# Patient Record
Sex: Male | Born: 1955
Health system: Southern US, Community
[De-identification: ages and names within clinical notes are randomized; demographics above are authoritative.]

## PROBLEM LIST (undated history)

## (undated) DIAGNOSIS — R112 Nausea with vomiting, unspecified: Secondary | ICD-10-CM

## (undated) DIAGNOSIS — T4145XA Adverse effect of unspecified anesthetic, initial encounter: Secondary | ICD-10-CM

## (undated) DIAGNOSIS — Z9889 Other specified postprocedural states: Secondary | ICD-10-CM

## (undated) DIAGNOSIS — M1990 Unspecified osteoarthritis, unspecified site: Secondary | ICD-10-CM

## (undated) DIAGNOSIS — T8859XA Other complications of anesthesia, initial encounter: Secondary | ICD-10-CM

## (undated) DIAGNOSIS — E785 Hyperlipidemia, unspecified: Secondary | ICD-10-CM

## (undated) HISTORY — DX: Other complications of anesthesia, initial encounter: T88.59XA

## (undated) HISTORY — DX: Unspecified osteoarthritis, unspecified site: M19.90

## (undated) HISTORY — PX: EYE SURGERY: SHX253

## (undated) HISTORY — DX: Hyperlipidemia, unspecified: E78.5

## (undated) HISTORY — DX: Adverse effect of unspecified anesthetic, initial encounter: T41.45XA

## (undated) HISTORY — PX: COLONOSCOPY: SHX174

---

## 1989-05-02 HISTORY — PX: KNEE SURGERY: SHX244

## 2004-07-06 ENCOUNTER — Ambulatory Visit: Payer: Self-pay | Admitting: Internal Medicine

## 2004-07-27 ENCOUNTER — Ambulatory Visit: Payer: Self-pay | Admitting: Internal Medicine

## 2004-09-07 ENCOUNTER — Ambulatory Visit: Payer: Self-pay | Admitting: Internal Medicine

## 2004-09-14 ENCOUNTER — Ambulatory Visit: Payer: Self-pay | Admitting: Internal Medicine

## 2004-09-24 ENCOUNTER — Ambulatory Visit: Payer: Self-pay | Admitting: Internal Medicine

## 2004-10-15 ENCOUNTER — Ambulatory Visit: Payer: Self-pay | Admitting: Internal Medicine

## 2005-06-02 ENCOUNTER — Ambulatory Visit: Payer: Self-pay | Admitting: Internal Medicine

## 2005-06-14 ENCOUNTER — Ambulatory Visit: Payer: Self-pay | Admitting: Internal Medicine

## 2005-11-28 ENCOUNTER — Ambulatory Visit: Payer: Self-pay | Admitting: Internal Medicine

## 2005-12-09 ENCOUNTER — Ambulatory Visit: Payer: Self-pay | Admitting: Internal Medicine

## 2006-03-07 ENCOUNTER — Ambulatory Visit: Payer: Self-pay | Admitting: Internal Medicine

## 2006-03-07 LAB — CONVERTED CEMR LAB
Bilirubin, Direct: 0.2 mg/dL (ref 0.0–0.3)
Chol/HDL Ratio, serum: 3.7
Cholesterol: 143 mg/dL (ref 0–200)
HDL: 38.2 mg/dL — ABNORMAL LOW (ref >39.0)
LDL Cholesterol: 84 mg/dL (ref 0–99)
Total Protein: 6.6 g/dL (ref 6.0–8.3)
Triglyceride fasting, serum: 106 mg/dL (ref 0–149)

## 2006-07-14 ENCOUNTER — Ambulatory Visit: Payer: Self-pay | Admitting: Internal Medicine

## 2006-08-15 ENCOUNTER — Ambulatory Visit: Payer: Self-pay | Admitting: Internal Medicine

## 2006-10-10 ENCOUNTER — Encounter: Admission: RE | Admit: 2006-10-10 | Discharge: 2006-10-10 | Payer: Self-pay | Admitting: Sports Medicine

## 2006-10-27 ENCOUNTER — Ambulatory Visit: Payer: Self-pay | Admitting: Internal Medicine

## 2006-10-27 DIAGNOSIS — M19049 Primary osteoarthritis, unspecified hand: Secondary | ICD-10-CM | POA: Insufficient documentation

## 2006-10-27 DIAGNOSIS — E785 Hyperlipidemia, unspecified: Secondary | ICD-10-CM | POA: Insufficient documentation

## 2006-12-21 ENCOUNTER — Telehealth: Payer: Self-pay | Admitting: Internal Medicine

## 2007-01-22 ENCOUNTER — Ambulatory Visit: Payer: Self-pay | Admitting: Internal Medicine

## 2007-01-22 ENCOUNTER — Encounter: Payer: Self-pay | Admitting: Internal Medicine

## 2007-01-22 DIAGNOSIS — M1990 Unspecified osteoarthritis, unspecified site: Secondary | ICD-10-CM | POA: Insufficient documentation

## 2007-01-22 DIAGNOSIS — M169 Osteoarthritis of hip, unspecified: Secondary | ICD-10-CM | POA: Insufficient documentation

## 2007-01-22 DIAGNOSIS — M1610 Unilateral primary osteoarthritis, unspecified hip: Secondary | ICD-10-CM | POA: Insufficient documentation

## 2007-01-22 LAB — CONVERTED CEMR LAB
Cholesterol, target level: 200 mg/dL
HDL goal, serum: 40 mg/dL
LDL Goal: 130 mg/dL

## 2007-02-12 ENCOUNTER — Telehealth (INDEPENDENT_AMBULATORY_CARE_PROVIDER_SITE_OTHER): Payer: Self-pay | Admitting: *Deleted

## 2007-02-13 ENCOUNTER — Ambulatory Visit: Payer: Self-pay | Admitting: Internal Medicine

## 2007-05-03 HISTORY — PX: OTHER SURGICAL HISTORY: SHX169

## 2007-05-10 ENCOUNTER — Ambulatory Visit: Payer: Self-pay | Admitting: Internal Medicine

## 2007-05-10 LAB — CONVERTED CEMR LAB
ALT: 41 units/L (ref 0–53)
AST: 43 units/L — ABNORMAL HIGH (ref 0–37)
Alkaline Phosphatase: 47 units/L (ref 39–117)
Basophils Relative: 0.5 % (ref 0.0–1.0)
Bilirubin, Direct: 0.3 mg/dL (ref 0.0–0.3)
CO2: 27 meq/L (ref 19–32)
Calcium: 9 mg/dL (ref 8.4–10.5)
Chloride: 102 meq/L (ref 96–112)
Creatinine, Ser: 1.2 mg/dL (ref 0.4–1.5)
Eosinophils Relative: 2.1 % (ref 0.0–5.0)
GFR calc Af Amer: 82 mL/min
Glucose, Bld: 91 mg/dL (ref 70–99)
LDL Cholesterol: 98 mg/dL (ref 0–99)
Lymphocytes Relative: 30.7 % (ref 12.0–46.0)
Neutro Abs: 3.5 10*3/uL (ref 1.4–7.7)
Nitrite: NEGATIVE
Platelets: 173 10*3/uL (ref 150–400)
RDW: 12.4 % (ref 11.5–14.6)
Specific Gravity, Urine: 1.015
Total Bilirubin: 1.7 mg/dL — ABNORMAL HIGH (ref 0.3–1.2)
Total Protein: 6.6 g/dL (ref 6.0–8.3)
Triglycerides: 76 mg/dL (ref 0–149)
VLDL: 15 mg/dL (ref 0–40)
WBC Urine, dipstick: NEGATIVE
WBC: 6.1 10*3/uL (ref 4.5–10.5)

## 2007-05-16 ENCOUNTER — Ambulatory Visit: Payer: Self-pay | Admitting: Internal Medicine

## 2007-06-11 ENCOUNTER — Encounter: Payer: Self-pay | Admitting: Internal Medicine

## 2007-06-13 ENCOUNTER — Encounter: Payer: Self-pay | Admitting: Internal Medicine

## 2007-06-18 ENCOUNTER — Telehealth: Payer: Self-pay | Admitting: Internal Medicine

## 2007-06-22 ENCOUNTER — Encounter: Payer: Self-pay | Admitting: Internal Medicine

## 2007-09-05 ENCOUNTER — Telehealth: Payer: Self-pay | Admitting: Internal Medicine

## 2007-09-25 ENCOUNTER — Telehealth (INDEPENDENT_AMBULATORY_CARE_PROVIDER_SITE_OTHER): Payer: Self-pay | Admitting: *Deleted

## 2007-10-29 ENCOUNTER — Ambulatory Visit: Payer: Self-pay | Admitting: Internal Medicine

## 2007-10-29 LAB — CONVERTED CEMR LAB
ALT: 31 units/L (ref 0–53)
Basophils Absolute: 0 10*3/uL (ref 0.0–0.1)
Basophils Relative: 0.4 % (ref 0.0–1.0)
Bilirubin Urine: NEGATIVE
Bilirubin, Direct: 0.2 mg/dL (ref 0.0–0.3)
Blood in Urine, dipstick: NEGATIVE
CO2: 29 meq/L (ref 19–32)
Calcium: 8.7 mg/dL (ref 8.4–10.5)
Chloride: 108 meq/L (ref 96–112)
Cholesterol: 141 mg/dL (ref 0–200)
Creatinine, Ser: 1.1 mg/dL (ref 0.4–1.5)
Eosinophils Absolute: 0.1 10*3/uL (ref 0.0–0.7)
GFR calc non Af Amer: 74 mL/min
Glucose, Urine, Semiquant: NEGATIVE
Ketones, urine, test strip: NEGATIVE
LDL Cholesterol: 72 mg/dL (ref 0–99)
Lymphocytes Relative: 25.4 % (ref 12.0–46.0)
MCHC: 34.1 g/dL (ref 30.0–36.0)
MCV: 89.6 fL (ref 78.0–100.0)
Neutro Abs: 3.5 10*3/uL (ref 1.4–7.7)
Neutrophils Relative %: 62.4 % (ref 43.0–77.0)
PSA: 0.33 ng/mL (ref 0.10–4.00)
Protein, U semiquant: NEGATIVE
RDW: 14 % (ref 11.5–14.6)
Sodium: 142 meq/L (ref 135–145)
TSH: 0.76 microintl units/mL (ref 0.35–5.50)
Total Bilirubin: 2 mg/dL — ABNORMAL HIGH (ref 0.3–1.2)
Triglycerides: 115 mg/dL (ref 0–149)
Urobilinogen, UA: 0.2
VLDL: 23 mg/dL (ref 0–40)
pH: 5.5

## 2007-11-05 ENCOUNTER — Ambulatory Visit: Payer: Self-pay | Admitting: Internal Medicine

## 2008-02-26 ENCOUNTER — Telehealth: Payer: Self-pay | Admitting: Internal Medicine

## 2008-04-09 ENCOUNTER — Telehealth: Payer: Self-pay | Admitting: Internal Medicine

## 2008-05-12 ENCOUNTER — Ambulatory Visit: Payer: Self-pay | Admitting: Internal Medicine

## 2008-05-12 LAB — CONVERTED CEMR LAB
ALT: 82 units/L — ABNORMAL HIGH (ref 0–53)
AST: 144 units/L — ABNORMAL HIGH (ref 0–37)
Albumin: 4.1 g/dL (ref 3.5–5.2)
Cholesterol: 144 mg/dL (ref 0–200)
HDL: 49.8 mg/dL (ref >39.0)
Total Protein: 6.8 g/dL (ref 6.0–8.3)
Triglycerides: 53 mg/dL (ref 0–149)
VLDL: 11 mg/dL (ref 0–40)

## 2008-05-19 ENCOUNTER — Ambulatory Visit: Payer: Self-pay | Admitting: Internal Medicine

## 2008-05-19 DIAGNOSIS — R7401 Elevation of levels of liver transaminase levels: Secondary | ICD-10-CM | POA: Insufficient documentation

## 2008-05-19 DIAGNOSIS — R740 Nonspecific elevation of levels of transaminase and lactic acid dehydrogenase [LDH]: Secondary | ICD-10-CM

## 2008-05-19 LAB — CONVERTED CEMR LAB
Hep B C IgM: NEGATIVE
Hepatitis B Surface Ag: NEGATIVE
LDL Goal: 130 mg/dL

## 2008-05-23 ENCOUNTER — Encounter: Payer: Self-pay | Admitting: Internal Medicine

## 2008-05-23 LAB — CONVERTED CEMR LAB
AST: 177 units/L — ABNORMAL HIGH (ref 0–37)
Albumin: 4.3 g/dL (ref 3.5–5.2)
Iron: 135 ug/dL (ref 42–165)
Saturation Ratios: 42.4 % (ref 20.0–50.0)
Total Bilirubin: 2.2 mg/dL — ABNORMAL HIGH (ref 0.3–1.2)
Transferrin: 227.4 mg/dL (ref >=212.0)

## 2008-05-27 IMAGING — RF DG FLUORO GUIDE NDL PLC/BX
2 series · 2 of 2 positions shown · IV contrast (magnevist)
Comparison: none

CLINICAL DATA: Right hip pain. 
 FLUOROSCOPICALLY GUIDED INJECTION OF THE RIGHT HIP FOR MR:
 The skin anterior and lateral to the right hip was cleansed and anesthetized.  A 22 gauge spinal needle was directed into the hip joint on one pass.  I injected 10 cc of liquid containing iodinated contrast, 1% Lidocaine, and a few drops of Magnevist.  The procedure was well-tolerated, and the patient was taken to MR immediately for scanning.

[Series 1: (hospital) · 1 of 1 slices shown (1 of 2)]
[im 1/1]
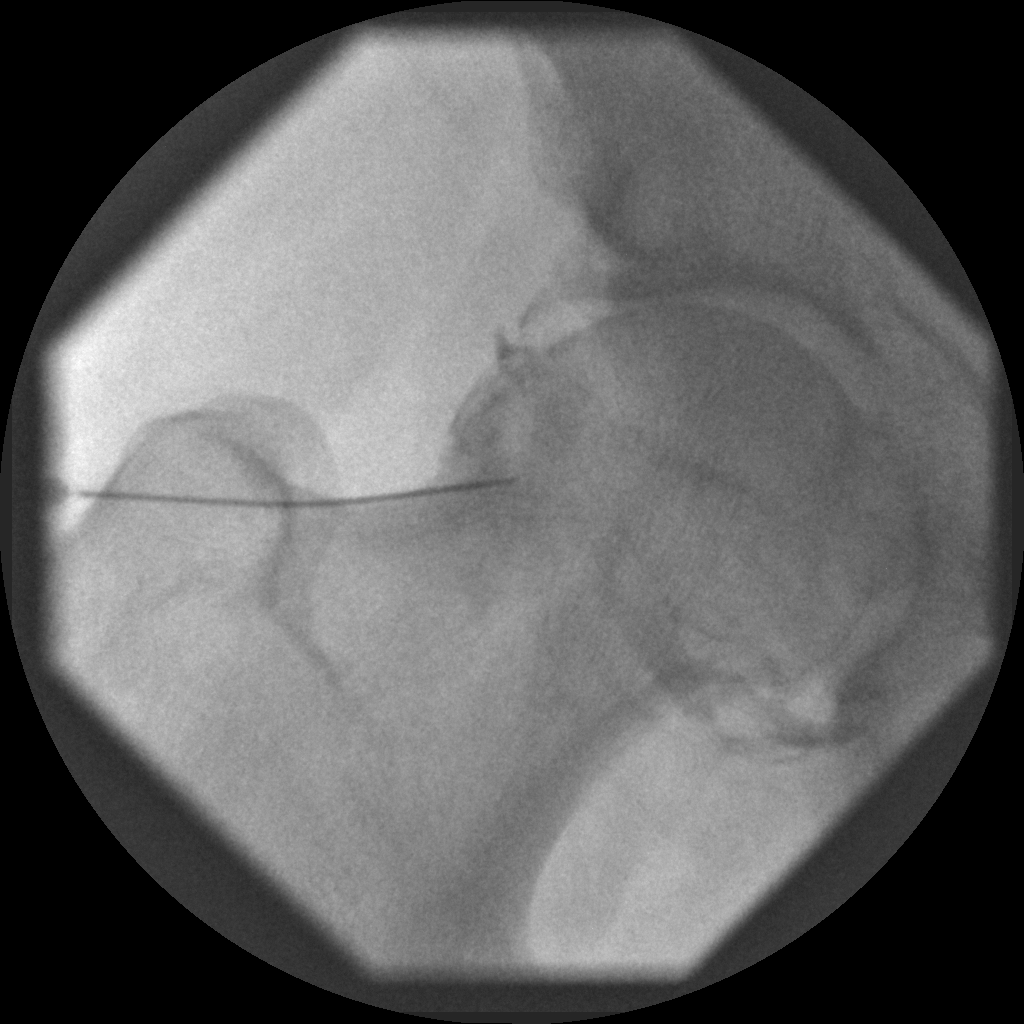

[Series 2: (hospital) · 1 of 1 slices shown (2 of 2)]
[im 1/1]
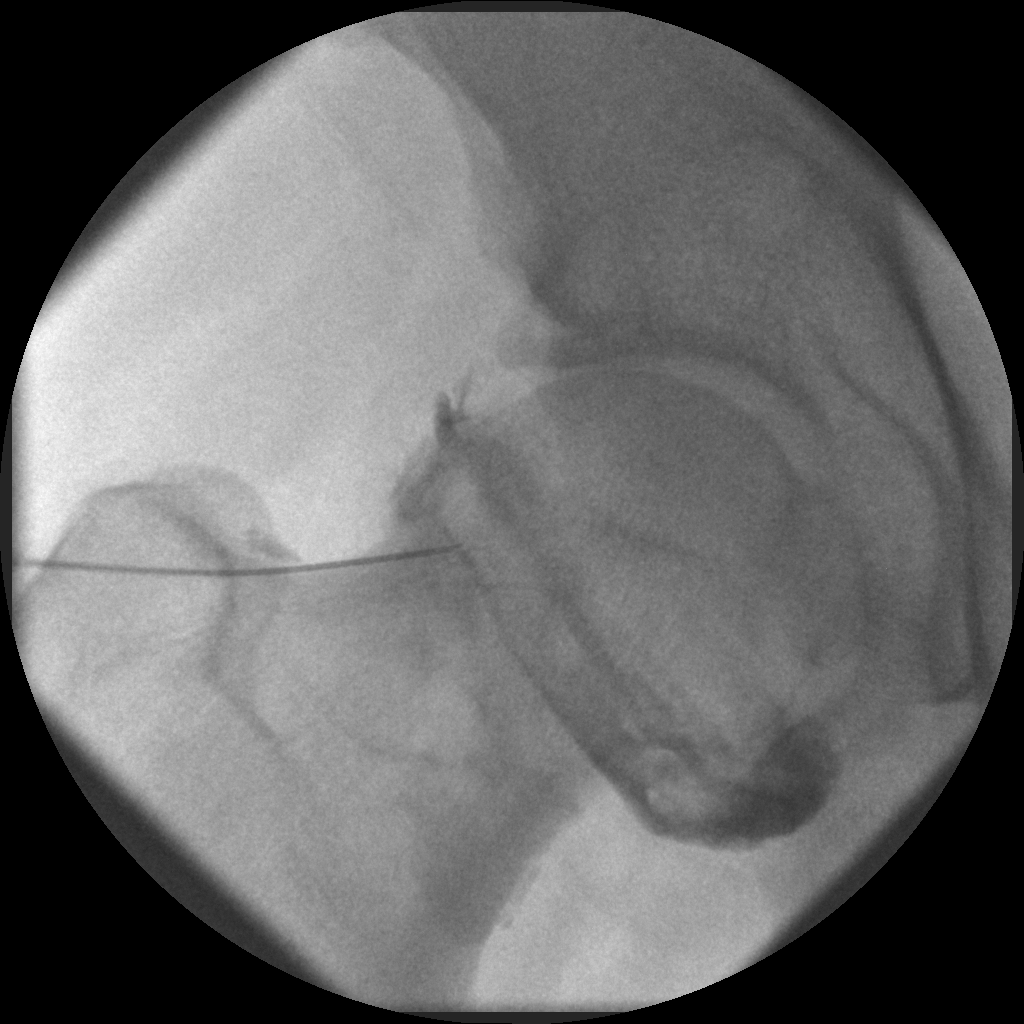

[2 of 2 positions shown; findings below may reference images not displayed]

IMPRESSION: Technically successful right hip injection for MRI.

## 2008-05-28 ENCOUNTER — Encounter: Admission: RE | Admit: 2008-05-28 | Discharge: 2008-05-28 | Payer: Self-pay | Admitting: Internal Medicine

## 2008-05-30 ENCOUNTER — Ambulatory Visit: Payer: Self-pay | Admitting: Internal Medicine

## 2008-05-30 LAB — CONVERTED CEMR LAB
ALT: 37 units/L (ref 0–53)
AST: 30 units/L (ref 0–37)
Total Bilirubin: 1.3 mg/dL — ABNORMAL HIGH (ref 0.3–1.2)
Total Protein: 6.6 g/dL (ref 6.0–8.3)

## 2008-06-19 ENCOUNTER — Encounter: Payer: Self-pay | Admitting: Internal Medicine

## 2008-07-31 ENCOUNTER — Telehealth: Payer: Self-pay | Admitting: Internal Medicine

## 2008-08-08 ENCOUNTER — Ambulatory Visit: Payer: Self-pay | Admitting: Internal Medicine

## 2008-08-08 LAB — CONVERTED CEMR LAB
AST: 22 units/L (ref 0–37)
Alkaline Phosphatase: 41 units/L (ref 39–117)
Bilirubin, Direct: 0.2 mg/dL (ref 0.0–0.3)
Cholesterol: 148 mg/dL (ref 0–200)
LDL Cholesterol: 84 mg/dL (ref 0–99)
Total Protein: 6.6 g/dL (ref 6.0–8.3)

## 2008-08-21 ENCOUNTER — Telehealth: Payer: Self-pay | Admitting: *Deleted

## 2008-10-09 ENCOUNTER — Telehealth: Payer: Self-pay | Admitting: Internal Medicine

## 2008-10-30 ENCOUNTER — Ambulatory Visit: Payer: Self-pay | Admitting: Internal Medicine

## 2008-10-30 LAB — CONVERTED CEMR LAB
ALT: 29 units/L (ref 0–53)
Alkaline Phosphatase: 43 units/L (ref 39–117)
Basophils Relative: 0.7 % (ref 0.0–3.0)
Bilirubin, Direct: 0 mg/dL (ref 0.0–0.3)
Blood in Urine, dipstick: NEGATIVE
Calcium: 8.8 mg/dL (ref 8.4–10.5)
Creatinine, Ser: 1 mg/dL (ref 0.4–1.5)
Eosinophils Absolute: 0.2 10*3/uL (ref 0.0–0.7)
Eosinophils Relative: 3.6 % (ref 0.0–5.0)
HDL: 53.2 mg/dL (ref >39.00)
Ketones, urine, test strip: NEGATIVE
LDL Cholesterol: 77 mg/dL (ref 0–99)
Lymphocytes Relative: 32.4 % (ref 12.0–46.0)
Monocytes Relative: 8.9 % (ref 3.0–12.0)
Neutrophils Relative %: 54.4 % (ref 43.0–77.0)
Nitrite: NEGATIVE
RBC: 4.42 M/uL (ref 4.22–5.81)
Total CHOL/HDL Ratio: 3
Total Protein: 6.6 g/dL (ref 6.0–8.3)
Triglycerides: 58 mg/dL (ref 0.0–149.0)
Urobilinogen, UA: 0.2
WBC Urine, dipstick: NEGATIVE
WBC: 4.8 10*3/uL (ref 4.5–10.5)

## 2008-11-24 ENCOUNTER — Ambulatory Visit: Payer: Self-pay | Admitting: Internal Medicine

## 2009-05-22 ENCOUNTER — Ambulatory Visit: Payer: Self-pay | Admitting: Internal Medicine

## 2009-05-22 LAB — CONVERTED CEMR LAB
Bilirubin, Direct: 0.2 mg/dL (ref 0.0–0.3)
Cholesterol: 158 mg/dL (ref 0–200)
LDL Cholesterol: 90 mg/dL (ref 0–99)
Total Protein: 7 g/dL (ref 6.0–8.3)
VLDL: 10.8 mg/dL (ref 0.0–40.0)

## 2009-05-29 ENCOUNTER — Ambulatory Visit: Payer: Self-pay | Admitting: Internal Medicine

## 2009-11-24 ENCOUNTER — Ambulatory Visit: Payer: Self-pay | Admitting: Internal Medicine

## 2009-11-24 LAB — CONVERTED CEMR LAB
BUN: 19 mg/dL (ref 6–23)
Bilirubin Urine: NEGATIVE
Bilirubin, Direct: 0.2 mg/dL (ref 0.0–0.3)
CO2: 32 meq/L (ref 19–32)
Chloride: 106 meq/L (ref 96–112)
Cholesterol: 165 mg/dL (ref 0–200)
Creatinine, Ser: 1.2 mg/dL (ref 0.4–1.5)
Eosinophils Absolute: 0.1 10*3/uL (ref 0.0–0.7)
Glucose, Bld: 97 mg/dL (ref 70–99)
Ketones, urine, test strip: NEGATIVE
LDL Cholesterol: 91 mg/dL (ref 0–99)
MCHC: 34.2 g/dL (ref 30.0–36.0)
MCV: 92.1 fL (ref 78.0–100.0)
Monocytes Absolute: 0.5 10*3/uL (ref 0.1–1.0)
Neutrophils Relative %: 57.2 % (ref 43.0–77.0)
PSA: 0.42 ng/mL (ref 0.10–4.00)
Platelets: 147 10*3/uL — ABNORMAL LOW (ref 150.0–400.0)
RDW: 13.6 % (ref 11.5–14.6)
Specific Gravity, Urine: 1.025
TSH: 1.46 microintl units/mL (ref 0.35–5.50)
Total Bilirubin: 1.4 mg/dL — ABNORMAL HIGH (ref 0.3–1.2)
Total Protein: 6.7 g/dL (ref 6.0–8.3)
Triglycerides: 114 mg/dL (ref 0.0–149.0)
Urobilinogen, UA: 0.2
pH: 5

## 2009-12-30 ENCOUNTER — Ambulatory Visit: Payer: Self-pay | Admitting: Internal Medicine

## 2009-12-30 DIAGNOSIS — J208 Acute bronchitis due to other specified organisms: Secondary | ICD-10-CM

## 2009-12-30 DIAGNOSIS — B9689 Other specified bacterial agents as the cause of diseases classified elsewhere: Secondary | ICD-10-CM | POA: Insufficient documentation

## 2010-01-13 IMAGING — US US ABDOMEN COMPLETE
1 series · 14 of 25 positions shown · non-contrast
Comparison: None

CLINICAL DATA: Abnormal liver function tests

ABDOMEN ULTRASOUND
TECHNIQUE: Complete abdominal ultrasound examination was performed
including evaluation of the liver, gallbladder, bile ducts,
pancreas, kidneys, spleen, IVC, and abdominal aorta.

[Series 1: us abdomen complete · 0.32mm/px · 14 of 74 slices shown]
[im 1/74]
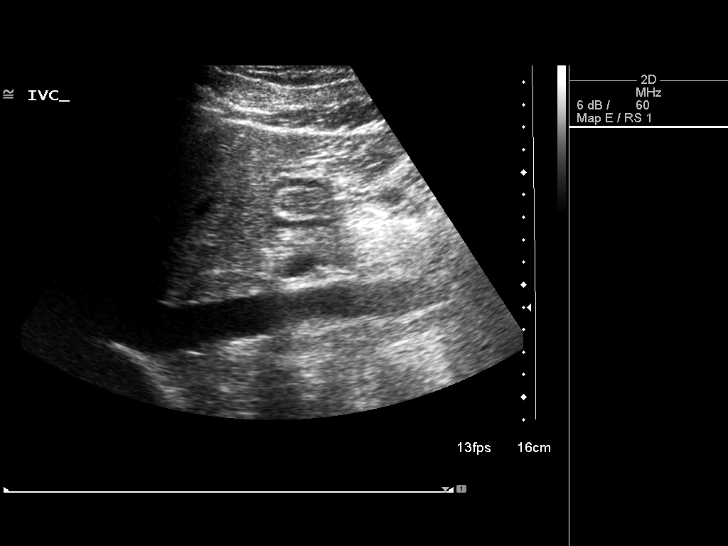
[im 7/74]
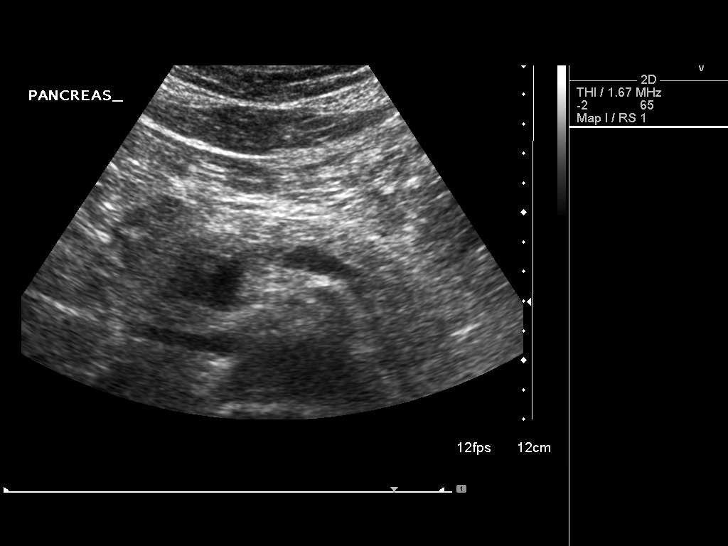
[im 13/74]
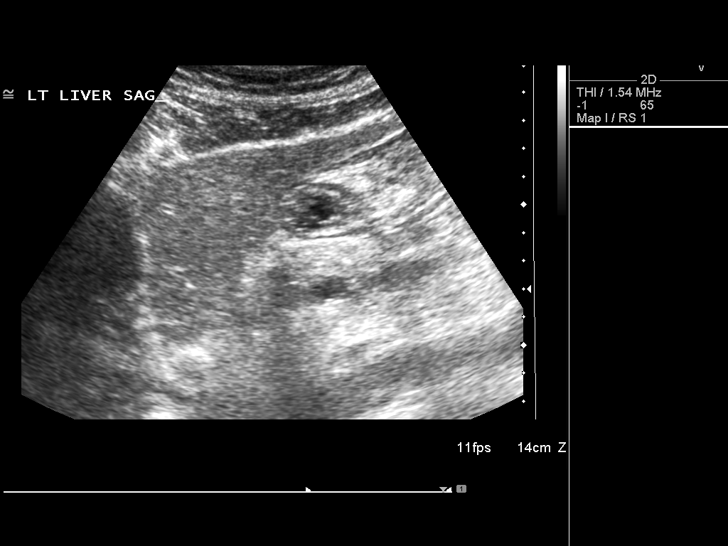
[im 19/74]
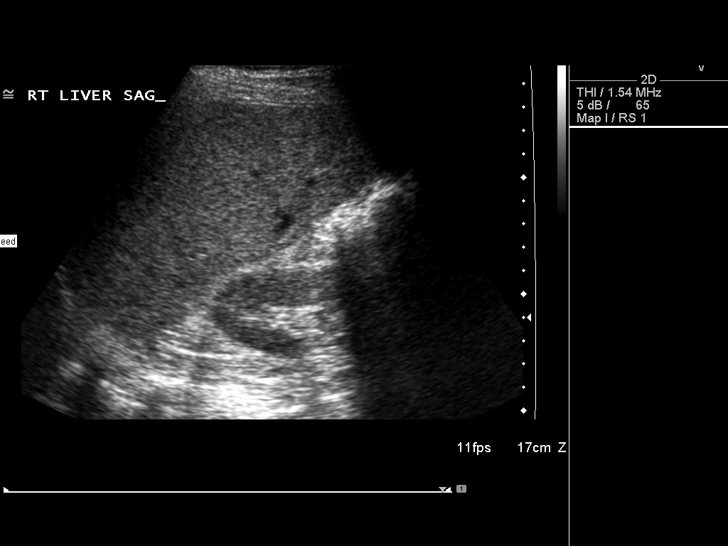
[im 25/74]
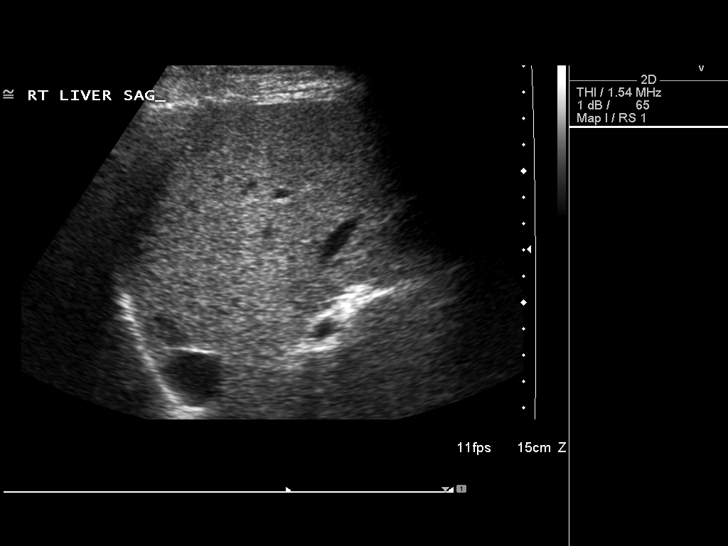
[im 28/74]
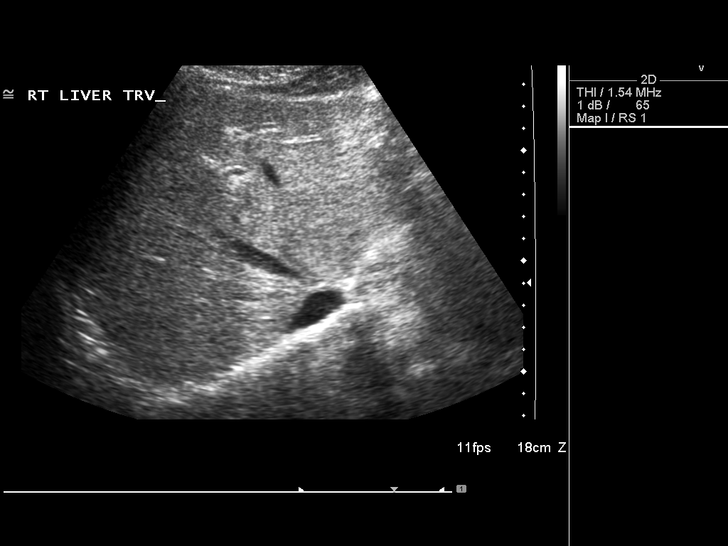
[im 34/74]
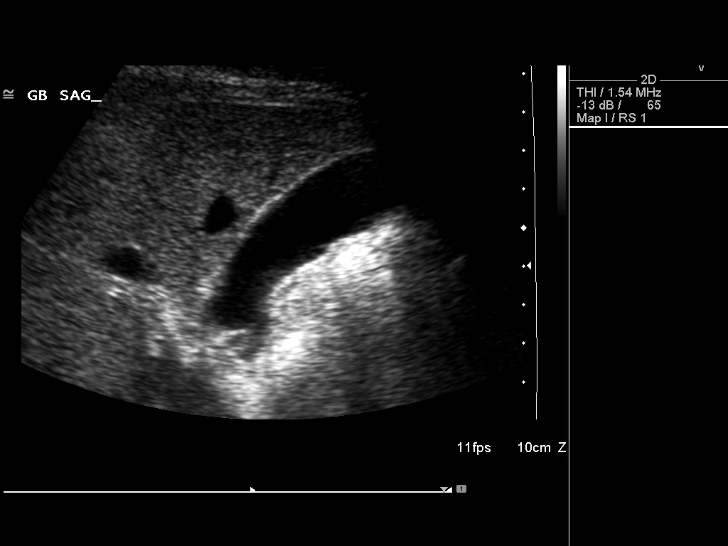
[im 40/74]
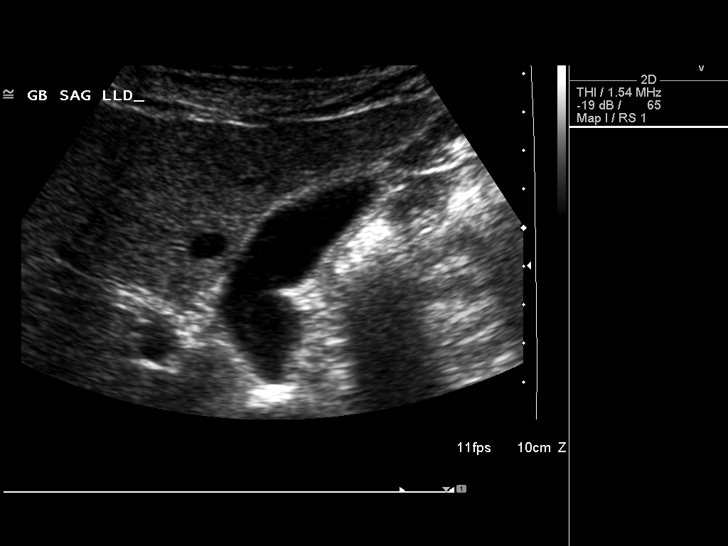
[im 46/74]
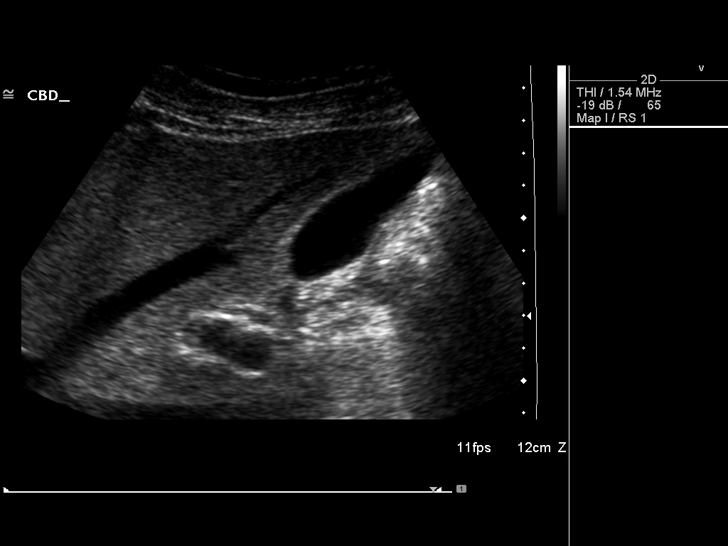
[im 49/74]
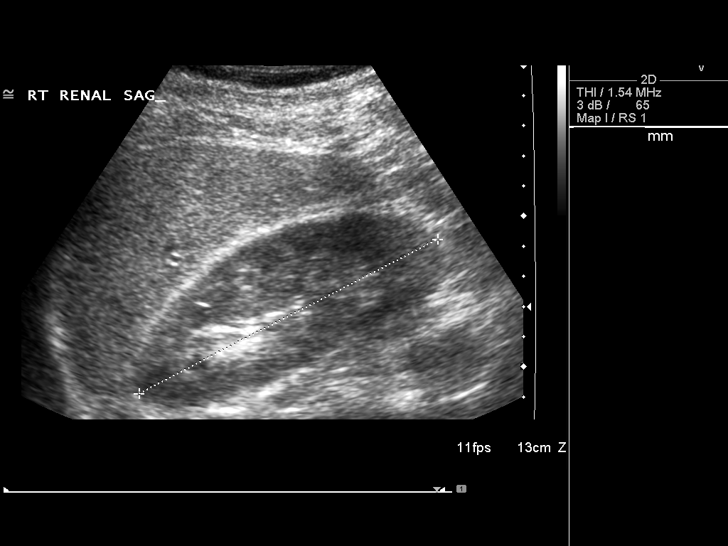
[im 55/74]
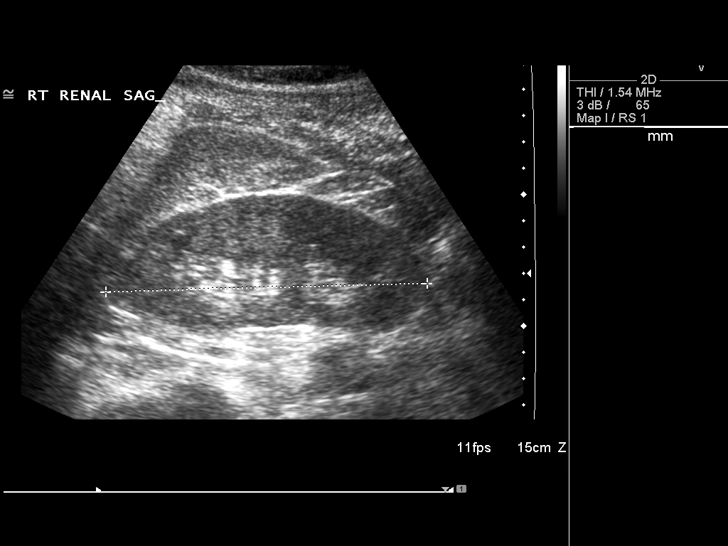
[im 61/74]
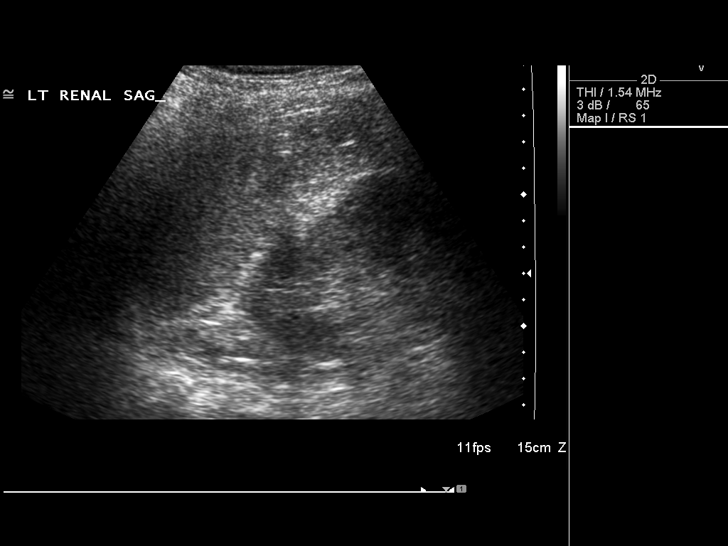
[im 67/74]
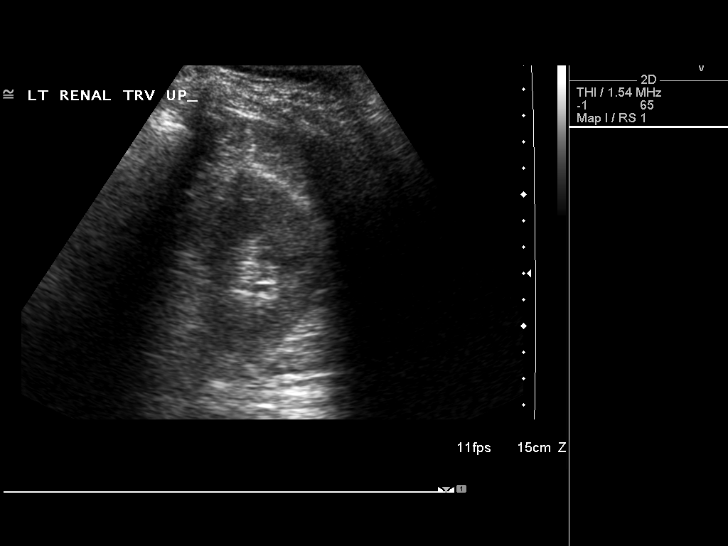
[im 74/74]
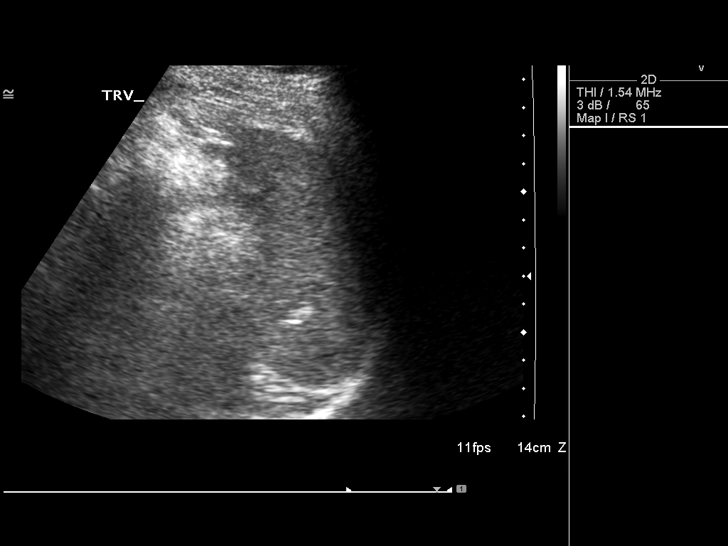

[14 of 25 positions shown; findings below may reference images not displayed]

FINDINGS: The gallbladder is well seen and no gallstones are noted.
The liver has a normal echogenic pattern and the common bile duct
is normal measuring 3.5 mm in diameter.  IVC, pancreas, and spleen
appear normal.  No hydronephrosis is seen.  The right kidney
measures 11.1 cm sagittally, with left kidney measuring 11.0 cm.
The abdominal aorta is normal in caliber.
IMPRESSION: Negative abdominal ultrasound.  No gallstones.  No ductal
dilatation.

## 2010-05-23 ENCOUNTER — Encounter: Payer: Self-pay | Admitting: Sports Medicine

## 2010-06-01 NOTE — Assessment & Plan Note (Signed)
Summary: cpx/mm/pt rescd//ccm   Vital Signs:  Patient profile:   54 year old male Height:      70 inches Weight:      195 pounds BMI:     28.08 Temp:     98.2 degrees F oral Pulse rate:   54 / minute Resp:     14 per minute BP sitting:   110 / 70  (left arm)  Vitals Entered By: Willy Eddy, LPN (December 30, 2009 11:43 AM) CC: cpx, Lipid Management Is Patient Diabetic? No   CC:  cpx and Lipid Management.  History of Present Illness: The pt has been stable with the HIP resurfacing with metal on metal hip with no problems   Lipid Management History:      Positive NCEP/ATP III risk factors include male age 49 years old or older and family history for ischemic heart disease (males less than 56 years old).  Negative NCEP/ATP III risk factors include non-diabetic, non-tobacco-user status, non-hypertensive, no ASHD (atherosclerotic heart disease), no prior stroke/TIA, no peripheral vascular disease, and no history of aortic aneurysm.     Preventive Screening-Counseling & Management  Alcohol-Tobacco     Smoking Status: never  Problems Prior to Update: 1)  Transaminases, Serum, Elevated  (ICD-790.4) 2)  Physical Examination  (ICD-V70.0) 3)  Asthma, Mild  (ICD-493.90) 4)  Lymphadenopathy  (ICD-785.6) 5)  Family History of Cad Male 1st Degree Relative <50  (ICD-V17.3) 6)  Osteoarthritis  (ICD-715.90) 7)  Osteoarthrosis, Local Nos, Pelvis/thigh  (ICD-715.35) 8)  Osteoarthrosis, Local Nos, Hand  (ICD-715.34) 9)  Hyperlipidemia  (ICD-272.4)  Current Problems (verified): 1)  Transaminases, Serum, Elevated  (ICD-790.4) 2)  Physical Examination  (ICD-V70.0) 3)  Asthma, Mild  (ICD-493.90) 4)  Lymphadenopathy  (ICD-785.6) 5)  Family History of Cad Male 1st Degree Relative <50  (ICD-V17.3) 6)  Osteoarthritis  (ICD-715.90) 7)  Osteoarthrosis, Local Nos, Pelvis/thigh  (ICD-715.35) 8)  Osteoarthrosis, Local Nos, Hand  (ICD-715.34) 9)  Hyperlipidemia  (ICD-272.4)  Medications  Prior to Update: 1)  Aspirin 81 Mg  Tabs (Aspirin) .... Once Daily 2)  Lipitor 20 Mg  Tabs (Atorvastatin Calcium) .... Take 1 Tablet By Mouth Once A Day 3)  Mega Multi Men   Tbcr (Multiple Vitamins-Minerals) .... Once Daily 4)  Fish Oil   Caps (Omega-3 Fatty Acids Caps) .... 2 Two Times A Day 5)  Pulmicort Flexhaler 180 Mcg/act  Inha (Budesonide) .... Use As Directed  Current Medications (verified): 1)  Aspirin 81 Mg  Tabs (Aspirin) .... Once Daily 2)  Lipitor 20 Mg  Tabs (Atorvastatin Calcium) .... Take 1 Tablet By Mouth Once A Day 3)  Mega Multi Men   Tbcr (Multiple Vitamins-Minerals) .... Once Daily 4)  Fish Oil   Caps (Omega-3 Fatty Acids Caps) .... 2 Two Times A Day 5)  Pulmicort Flexhaler 180 Mcg/act  Inha (Budesonide) .... Use As Directed  Allergies (verified): 1)  ! Pcn  Past History:  Family History: Last updated: 01/22/2007 Family History Lung cancer  mother Family History of Stroke F 1st degree relative <60  Family History of CAD Male 1st degree relative <50 Family History of Stroke M 1st degree relative <50 maternal grand father daibetes in maternal aunt  Social History: Last updated: 10/27/2006 Married Never Smoked Alcohol use-yes Drug use-no  Risk Factors: Smoking Status: never (12/30/2009)  Past medical, surgical, family and social histories (including risk factors) reviewed, and no changes noted (except as noted below).  Past Medical History: Reviewed history from 01/22/2007 and  no changes required. Hyperlipidemia Osteoarthritis  Past Surgical History: Reviewed history from 11/05/2007 and no changes required. Denies surgical history Total hip resurface right  Family History: Reviewed history from 01/22/2007 and no changes required. Family History Lung cancer  mother Family History of Stroke F 1st degree relative <60  Family History of CAD Male 1st degree relative <50 Family History of Stroke M 1st degree relative <50 maternal grand  father daibetes in maternal aunt  Social History: Reviewed history from 10/27/2006 and no changes required. Married Never Smoked Alcohol use-yes Drug use-no  Review of Systems  The patient denies anorexia, fever, weight loss, weight gain, vision loss, decreased hearing, hoarseness, chest pain, syncope, dyspnea on exertion, peripheral edema, prolonged cough, headaches, hemoptysis, abdominal pain, melena, hematochezia, severe indigestion/heartburn, hematuria, incontinence, genital sores, muscle weakness, suspicious skin lesions, transient blindness, difficulty walking, depression, unusual weight change, abnormal bleeding, enlarged lymph nodes, angioedema, breast masses, and testicular masses.    Physical Exam  General:  Well-developed,well-nourished,in no acute distress; alert,appropriate and cooperative throughout examination Head:  normocephalic, atraumatic, and male-pattern balding.   Eyes:  No corneal or conjunctival inflammation noted. EOMI. Perrla. Funduscopic exam benign, without hemorrhages, exudates or papilledema. Vision grossly normal. Ears:  R ear normal and L ear normal.   Nose:  External nasal examination shows no deformity or inflammation. Nasal mucosa are pink and moist without lesions or exudates. Mouth:  Oral mucosa and oropharynx without lesions or exudates.  Teeth in good repair. Neck:  No deformities, masses, or tenderness noted. Chest Wall:  No deformities, masses, tenderness or gynecomastia noted. Breasts:  No masses or gynecomastia noted Lungs:  normal respiratory effort and no intercostal retractions.   Heart:  normal rate and regular rhythm.   Abdomen:  Bowel sounds positive,abdomen soft and non-tender without masses, organomegaly or hernias noted. Msk:  no joint swelling, no joint warmth, and no redness over joints.   Pulses:  R and L carotid,radial,femoral,dorsalis pedis and posterior tibial pulses are full and equal bilaterally Extremities:  No clubbing,  cyanosis, edema, or deformity noted with normal full range of motion of all joints.   Neurologic:  No cranial nerve deficits noted. Station and gait are normal. Plantar reflexes are down-going bilaterally. DTRs are symmetrical throughout. Sensory, motor and coordinative functions appear intact. Skin:  Intact without suspicious lesions or rashes Cervical Nodes:  No lymphadenopathy noted Axillary Nodes:  No palpable lymphadenopathy Inguinal Nodes:  No significant adenopathy Psych:  Cognition and judgment appear intact. Alert and cooperative with normal attention span and concentration. No apparent delusions, illusions, hallucinations    Impression & Recommendations:  Problem # 1:  PHYSICAL EXAMINATION (ICD-V70.0)  Orders: EKG w/ Interpretation (93000)  Colonoscopy: Normal (10/15/2004) Td Booster: Historical (05/02/2005)   Chol: 165 (11/24/2009)   HDL: 51.00 (11/24/2009)   LDL: 91 (11/24/2009)   TG: 114.0 (11/24/2009) TSH: 1.46 (11/24/2009)   PSA: 0.42 (11/24/2009)  Discussed using sunscreen, use of alcohol, drug use, self testicular exam, routine dental care, routine eye care, routine physical exam, seat belts, multiple vitamins, osteoporosis prevention, adequate calcium intake in diet, and recommendations for immunizations.  Discussed exercise and checking cholesterol.  Discussed gun safety, safe sex, and contraception. Also recommend checking PSA.  Problem # 2:  HYPERLIPIDEMIA (ICD-272.4)  His updated medication list for this problem includes:    Lipitor 20 Mg Tabs (Atorvastatin calcium) .Marland Kitchen... Take 1 tablet by mouth once a day  Labs Reviewed: SGOT: 27 (11/24/2009)   SGPT: 28 (11/24/2009)  Lipid Goals: Chol Goal: 200 (  05/16/2007)   HDL Goal: 40 (05/16/2007)   LDL Goal: 130 (05/19/2008)   TG Goal: 150 (05/19/2008)  Prior 10 Yr Risk Heart Disease: 3 % (05/16/2007)   HDL:51.00 (11/24/2009), 57.70 (05/22/2009)  LDL:91 (11/24/2009), 90 (05/22/2009)  Chol:165 (11/24/2009), 158  (05/22/2009)  Trig:114.0 (11/24/2009), 54.0 (05/22/2009)  Problem # 3:  COUGH VARIANT ASTHMA (ICD-493.82) has very very mild reactive airway dz that seems to be related the cold and running  but no air flow or capacity issues His updated medication list for this problem includes:    Pulmicort Flexhaler 180 Mcg/act Inha (Budesonide) ..... Use as directed  Complete Medication List: 1)  Aspirin 81 Mg Tabs (Aspirin) .... Once daily 2)  Lipitor 20 Mg Tabs (Atorvastatin calcium) .... Take 1 tablet by mouth once a day 3)  Mega Multi Men Tbcr (Multiple vitamins-minerals) .... Once daily 4)  Fish Oil Caps (Omega-3 fatty acids caps) .... 2 two times a day 5)  Pulmicort Flexhaler 180 Mcg/act Inha (Budesonide) .... Use as directed  Lipid Assessment/Plan:      Based on NCEP/ATP III, the patient's risk factor category is "2 or more risk factors and a calculated 10 year CAD risk of < 20%".  The patient's lipid goals are as follows: Total cholesterol goal is 200; LDL cholesterol goal is 130; HDL cholesterol goal is 40; Triglyceride goal is 150.  His LDL cholesterol goal has been met.    Patient Instructions: 1)  Please schedule a follow-up appointment in 6 months. 2)  Hepatic Panel prior to visit, ICD-9:995.20 3)  Lipid Panel prior to visit, ICD-9:272.4

## 2010-06-01 NOTE — Assessment & Plan Note (Signed)
Summary: 6 month rov/njr   Vital Signs:  Patient profile:   55 year old male Height:      70 inches Weight:      196 pounds BMI:     28.22 Temp:     98.2 degrees F oral Pulse rate:   60 / minute Resp:     14 per minute BP sitting:   104 / 60  (left arm)  Vitals Entered By: Willy Eddy, LPN (May 29, 2009 8:21 AM) CC: r9o labs, Lipid Management   CC:  r9o labs and Lipid Management.  History of Present Illness: lipid follow up  episode of loc upon arrising x 1 loc one min or less with no  other symptoms and worked out that day the symptoms are most likey vasovagal no palpitations or chest pain  Lipid Management History:      Positive NCEP/ATP III risk factors include male age 89 years old or older and family history for ischemic heart disease (males less than 29 years old).  Negative NCEP/ATP III risk factors include non-diabetic, non-tobacco-user status, non-hypertensive, no ASHD (atherosclerotic heart disease), no prior stroke/TIA, no peripheral vascular disease, and no history of aortic aneurysm.     Preventive Screening-Counseling & Management  Alcohol-Tobacco     Smoking Status: never  Problems Prior to Update: 1)  Transaminases, Serum, Elevated  (ICD-790.4) 2)  Physical Examination  (ICD-V70.0) 3)  Asthma, Mild  (ICD-493.90) 4)  Lymphadenopathy  (ICD-785.6) 5)  Family History of Cad Male 1st Degree Relative <50  (ICD-V17.3) 6)  Osteoarthritis  (ICD-715.90) 7)  Osteoarthrosis, Local Nos, Pelvis/thigh  (ICD-715.35) 8)  Osteoarthrosis, Local Nos, Hand  (ICD-715.34) 9)  Hyperlipidemia  (ICD-272.4)  Medications Prior to Update: 1)  Aspirin 81 Mg  Tabs (Aspirin) .... Once Daily 2)  Lipitor 20 Mg  Tabs (Atorvastatin Calcium) .... Take 1 Tablet By Mouth Once A Day 3)  Mega Multi Men   Tbcr (Multiple Vitamins-Minerals) .... Once Daily 4)  Fish Oil   Caps (Omega-3 Fatty Acids Caps) .... 2 Two Times A Day 5)  Pulmicort Flexhaler 180 Mcg/act  Inha (Budesonide)  .... Use As Directed  Current Medications (verified): 1)  Aspirin 81 Mg  Tabs (Aspirin) .... Once Daily 2)  Lipitor 20 Mg  Tabs (Atorvastatin Calcium) .... Take 1 Tablet By Mouth Once A Day 3)  Mega Multi Men   Tbcr (Multiple Vitamins-Minerals) .... Once Daily 4)  Fish Oil   Caps (Omega-3 Fatty Acids Caps) .... 2 Two Times A Day 5)  Pulmicort Flexhaler 180 Mcg/act  Inha (Budesonide) .... Use As Directed  Allergies (verified): 1)  ! Pcn  Past History:  Family History: Last updated: 01/22/2007 Family History Lung cancer  mother Family History of Stroke F 1st degree relative <60  Family History of CAD Male 1st degree relative <50 Family History of Stroke M 1st degree relative <50 maternal grand father daibetes in maternal aunt  Social History: Last updated: 10/27/2006 Married Never Smoked Alcohol use-yes Drug use-no  Risk Factors: Smoking Status: never (05/29/2009)  Past medical, surgical, family and social histories (including risk factors) reviewed, and no changes noted (except as noted below).  Past Medical History: Reviewed history from 01/22/2007 and no changes required. Hyperlipidemia Osteoarthritis  Past Surgical History: Reviewed history from 11/05/2007 and no changes required. Denies surgical history Total hip resurface right  Family History: Reviewed history from 01/22/2007 and no changes required. Family History Lung cancer  mother Family History of Stroke F 1st degree relative <  60  Family History of CAD Male 1st degree relative <50 Family History of Stroke M 1st degree relative <50 maternal grand father daibetes in maternal aunt  Social History: Reviewed history from 10/27/2006 and no changes required. Married Never Smoked Alcohol use-yes Drug use-no  Review of Systems  The patient denies anorexia, fever, weight loss, weight gain, vision loss, decreased hearing, hoarseness, chest pain, syncope, dyspnea on exertion, peripheral edema, prolonged  cough, headaches, hemoptysis, abdominal pain, melena, hematochezia, severe indigestion/heartburn, hematuria, incontinence, genital sores, muscle weakness, suspicious skin lesions, transient blindness, difficulty walking, depression, unusual weight change, abnormal bleeding, enlarged lymph nodes, angioedema, breast masses, and testicular masses.    Physical Exam  General:  Well-developed,well-nourished,in no acute distress; alert,appropriate and cooperative throughout examination Head:  normocephalic, atraumatic, and male-pattern balding.   Ears:  R ear normal and L ear normal.   Nose:  no external deformity and no nasal discharge.   Neck:  No deformities, masses, or tenderness noted. Lungs:  normal respiratory effort and no intercostal retractions.   Heart:  normal rate and regular rhythm.   Abdomen:  Bowel sounds positive,abdomen soft and non-tender without masses, organomegaly or hernias noted. Extremities:  No clubbing, cyanosis, edema, or deformity noted with normal full range of motion of all joints.   Neurologic:  No cranial nerve deficits noted. Station and gait are normal. Plantar reflexes are down-going bilaterally. DTRs are symmetrical throughout. Sensory, motor and coordinative functions appear intact.   Impression & Recommendations:  Problem # 1:  HYPERLIPIDEMIA (ICD-272.4)  His updated medication list for this problem includes:    Lipitor 20 Mg Tabs (Atorvastatin calcium) .Marland Kitchen... Take 1 tablet by mouth once a day  Labs Reviewed: SGOT: 28 (05/22/2009)   SGPT: 28 (05/22/2009)  Lipid Goals: Chol Goal: 200 (05/16/2007)   HDL Goal: 40 (05/16/2007)   LDL Goal: 130 (05/19/2008)   TG Goal: 150 (05/19/2008)  Prior 10 Yr Risk Heart Disease: 3 % (05/16/2007)   HDL:57.70 (05/22/2009), 53.20 (10/30/2008)  LDL:90 (05/22/2009), 77 (10/30/2008)  Chol:158 (05/22/2009), 142 (10/30/2008)  Trig:54.0 (05/22/2009), 58.0 (10/30/2008)  Problem # 2:  OSTEOARTHROSIS, LOCAL NOS, PELVIS/THIGH  (ICD-715.35)  His updated medication list for this problem includes:    Aspirin 81 Mg Tabs (Aspirin) ..... Once daily  Problem # 3:  SYNCOPE, VASOVAGAL (ICD-780.2) Assessment: New single pisode reported that seems mostlike to be vasogal  had AAA check 10/10  Complete Medication List: 1)  Aspirin 81 Mg Tabs (Aspirin) .... Once daily 2)  Lipitor 20 Mg Tabs (Atorvastatin calcium) .... Take 1 tablet by mouth once a day 3)  Mega Multi Men Tbcr (Multiple vitamins-minerals) .... Once daily 4)  Fish Oil Caps (Omega-3 fatty acids caps) .... 2 two times a day 5)  Pulmicort Flexhaler 180 Mcg/act Inha (Budesonide) .... Use as directed  Lipid Assessment/Plan:      Based on NCEP/ATP III, the patient's risk factor category is "2 or more risk factors and a calculated 10 year CAD risk of < 20%".  The patient's lipid goals are as follows: Total cholesterol goal is 200; LDL cholesterol goal is 130; HDL cholesterol goal is 40; Triglyceride goal is 150.  His LDL cholesterol goal has been met.    Patient Instructions: 1)  July CPX

## 2010-06-08 ENCOUNTER — Telehealth: Payer: Self-pay | Admitting: *Deleted

## 2010-06-08 NOTE — Telephone Encounter (Signed)
[facility] Triage Call Report Triage Record Num: [id number] Operator: Kathleen Lime Patient Name: Kristopher Holden Call Date & Time: 06/07/2010 10:21:34PM Patient Phone: [phone] PCP: Patient Gender: Male PCP Fax : Patient DOB: 1956/02/21 Practice Name: [facility] Reason for Call: Colbert Coyer, calling . Pt was vomiting all morning (2/6), temp was 101.4 at 9:45pm. Pt has not vomited for 12 hours, wife has been giving Phenegran (prescribed earlier by office). Pt continues to feel nauseated, especially when lying on side, also has headache. Has not eated today and drinking minimally. Emergent sxs for Nausea or Vomiting Protocol R/O Advised wife to give pt fluids and some crackers or toast. Take Tylenol for fever and headache. DC Phenegran to see if vomiting returns. F/U with office if fever continues into AM. Protocol(s) Used: Nausea or Vomiting Recommended Outcome per Protocol: Provide Home/Self Care Reason for Outcome: All other situations Care Advice: Call provider if symptoms continue for 24 hours, blood in vomit, severe abdominal pain, fever over 101.5 F (38.6 C), rapid breathing or pulse, or severe vomiting with diarrhea. ~ ~ SYMPTOM / CONDITION MANAGEMENT ~ CAUTIONS Nausea Care Advice: - Drink small amounts of clear, sweetened liquids or ice cold drinks. - Eat light, bland foods such as saltine crackers or plain bread. - Do not eat high fat, highly seasoned, high fiber, or high sugar content foods. - Avoid mixing hot food and cold foods. - Eat smaller, more frequent meals. - Rest as much as possible in a sitting or in a propped lying position. Do not lie flat for at least 2 hours after eating. - Do not take pain medication (such as aspirin, NSAIDs) while nauseated. - Rest as much as possible until symptoms improve since activity may worsen nausea. ~ Go to the ED if you have developed signs and symptoms of dehydration such as very dry mouth and tongue;  increased pulse rate at rest; no urine output for 8 hours or more; increasing weakness or drowsiness, or lightheadedness when trying to sit upright or standing. ~ Vomiting Care Advice: - Do not eat solid foods until vomiting subsides. - Begin taking fluids by sucking on ice chips or popsicles or taking sips of cool clear, nonprescription oral rehydration solution). - Gradually drink larger amounts of these fluids so that you are drinking six to eight 8 oz. (.2 liter) of fluids a day. - Keep activity to a minimum. - After vomiting subsides, eat smaller, more frequent meals of easily digested foods such as crackers, toast, bananas, rice, cooked cereal, applesauce, broth, baked or mashed potatoes, chicken or [place] without skin. Eat slowly. - Take fluids 30 minutes before or 60 minutes after meals. - Avoid high fat, highly seasoned, high fiber or high sugar content foods. - Avoid extremely hot or cold foods. - Do not take pain medication (such as aspirin, NSAIDs) while nauseated or vomiting. - Consult your provider for advice regarding continuing prescription medication. - Rest as much as possible in a sitting or in a propped lying position. Do not lie flat for at least 2 hours after eating. ~ 06/08/2010 5:01:32AM Page 1 of 1 [id number]

## 2010-06-08 NOTE — Telephone Encounter (Signed)
Please call and see if needs to come in or any rx needed

## 2010-06-08 NOTE — Telephone Encounter (Signed)
Talked with p[t and he states he is much better- has eaten toast and keeping down liquids now

## 2010-06-11 ENCOUNTER — Telehealth: Payer: Self-pay | Admitting: *Deleted

## 2010-06-11 DIAGNOSIS — J329 Chronic sinusitis, unspecified: Secondary | ICD-10-CM

## 2010-06-11 MED ORDER — AZITHROMYCIN 250 MG PO TABS: 250.0000 mg | ORAL_TABLET | Freq: Every day | ORAL | Status: AC

## 2010-06-11 NOTE — Telephone Encounter (Signed)
Pt is feeling better from GI virus, but has developed sore throat, and sinus congestion with headache, and is requesting a Zpack to [facility].

## 2010-06-11 NOTE — Telephone Encounter (Signed)
May send zpac

## 2010-06-11 NOTE — Telephone Encounter (Signed)
Left message on pt's voice mail to pick up meds.

## 2010-06-23 ENCOUNTER — Other Ambulatory Visit (INDEPENDENT_AMBULATORY_CARE_PROVIDER_SITE_OTHER): Payer: BC Managed Care – PPO | Admitting: Internal Medicine

## 2010-06-23 DIAGNOSIS — T887XXA Unspecified adverse effect of drug or medicament, initial encounter: Secondary | ICD-10-CM

## 2010-06-23 DIAGNOSIS — E785 Hyperlipidemia, unspecified: Secondary | ICD-10-CM

## 2010-06-23 LAB — HEPATIC FUNCTION PANEL
AST: 34 U/L (ref 0–37)
Alkaline Phosphatase: 45 U/L (ref 39–117)
Total Bilirubin: 1.5 mg/dL — ABNORMAL HIGH (ref 0.3–1.2)

## 2010-06-23 LAB — LIPID PANEL
Cholesterol: 136 mg/dL (ref 0–200)
HDL: 43.4 mg/dL (ref >39.00)
LDL Cholesterol: 77 mg/dL (ref 0–99)
VLDL: 15.2 mg/dL (ref 0.0–40.0)

## 2010-06-29 ENCOUNTER — Encounter: Payer: Self-pay | Admitting: Internal Medicine

## 2010-06-30 ENCOUNTER — Ambulatory Visit (INDEPENDENT_AMBULATORY_CARE_PROVIDER_SITE_OTHER): Payer: 59 | Admitting: Internal Medicine

## 2010-06-30 ENCOUNTER — Encounter: Payer: Self-pay | Admitting: Internal Medicine

## 2010-06-30 DIAGNOSIS — R740 Nonspecific elevation of levels of transaminase and lactic acid dehydrogenase [LDH]: Secondary | ICD-10-CM

## 2010-06-30 DIAGNOSIS — R05 Cough: Secondary | ICD-10-CM | POA: Insufficient documentation

## 2010-06-30 DIAGNOSIS — M7662 Achilles tendinitis, left leg: Secondary | ICD-10-CM | POA: Insufficient documentation

## 2010-06-30 DIAGNOSIS — E785 Hyperlipidemia, unspecified: Secondary | ICD-10-CM

## 2010-06-30 DIAGNOSIS — R053 Chronic cough: Secondary | ICD-10-CM | POA: Insufficient documentation

## 2010-06-30 DIAGNOSIS — Z0000 Encounter for general adult medical examination without abnormal findings: Secondary | ICD-10-CM

## 2010-06-30 DIAGNOSIS — R059 Cough, unspecified: Secondary | ICD-10-CM

## 2010-06-30 DIAGNOSIS — M7660 Achilles tendinitis, unspecified leg: Secondary | ICD-10-CM

## 2010-06-30 DIAGNOSIS — R7401 Elevation of levels of liver transaminase levels: Secondary | ICD-10-CM

## 2010-06-30 DIAGNOSIS — J45991 Cough variant asthma: Secondary | ICD-10-CM

## 2010-06-30 DIAGNOSIS — M169 Osteoarthritis of hip, unspecified: Secondary | ICD-10-CM

## 2010-06-30 MED ORDER — BENZONATATE 200 MG PO CAPS: 200.0000 mg | ORAL_CAPSULE | Freq: Three times a day (TID) | ORAL | Status: AC | PRN

## 2010-06-30 MED ORDER — METHYLPREDNISOLONE (PAK) 4 MG PO TABS: 4.0000 mg | ORAL_TABLET | Freq: Every day | ORAL | Status: DC

## 2010-06-30 NOTE — Assessment & Plan Note (Signed)
Liver functions have remained normal for over a year and therefore we are going to resolve this problem from his problem with

## 2010-06-30 NOTE — Assessment & Plan Note (Signed)
I believe his symptoms are consistent with a post infectious cough and will treat with Tessalon Perles to extinguish the cough and Medrol as an anti-inflammatory the

## 2010-06-30 NOTE — Assessment & Plan Note (Signed)
Point tenderness along the the middle section of his left Achilles tendon I cannot detect if there is a slight rupture or not however the degree of tenderness would be suspicious for this he has been a runner and has recently switched running barefoot to avoid injury. His running is complicated by the fact that he has a partial hip replacement with resurfacing done at [facility]

## 2010-06-30 NOTE — Progress Notes (Signed)
  Subjective:    Patient ID: Kristopher Holden, male    DOB: 06/10/1955, 55 y.o.   MRN: [id number]  HPI    Review of Systems     Objective:   Physical Exam        Assessment & Plan:

## 2010-07-22 ENCOUNTER — Encounter: Payer: Self-pay | Admitting: Family Medicine

## 2010-07-22 ENCOUNTER — Encounter: Payer: Self-pay | Admitting: Sports Medicine

## 2010-07-22 ENCOUNTER — Ambulatory Visit (INDEPENDENT_AMBULATORY_CARE_PROVIDER_SITE_OTHER): Payer: 59 | Admitting: Sports Medicine

## 2010-07-22 VITALS — BP 116/84 | Ht 71.0 in | Wt 190.0 lb

## 2010-07-22 DIAGNOSIS — M7660 Achilles tendinitis, unspecified leg: Secondary | ICD-10-CM

## 2010-07-22 DIAGNOSIS — M7662 Achilles tendinitis, left leg: Secondary | ICD-10-CM

## 2010-07-22 MED ORDER — KETOPROFEN POWD: Status: DC

## 2010-07-22 NOTE — Progress Notes (Signed)
  Subjective:    Patient ID: Kristopher Holden, male    DOB: Jul 30, 1955, 55 y.o.   MRN: [id number]  HPI 55yo male to office with c/o L achilles pain.  He is s/p R hip resurfacing procedure 3-years ago with [place] [facility] (performed at [facility]), recently released to return to running.  Has started doing some barefoot running about 77-months ago, only running 10-15 mins at a time on an indoor track or treadmill. Decided to try barefoot running b/c is a prominent heel striker & thought it may be better for his hip. Started to develop increasing pain in the left achilles with some surrounding swelling.  Denies feeling tearing sensation or a pop.  Using tylenol as needed which is helpful.  Icing intermittently.  Seen by PCP (Dr. Lovell Sheehan) who referred him to [place] for further evaluation.  No previous hx of achilles injury.    Job: he is a Research scientist (medical)    Review of Systems Per HPI  BP 116/84  Ht 5\' 11"  (1.803 m)  Wt 190 lb (86.183 kg)  BMI 26.50 kg/m2     Objective:   Physical Exam GEN: AOx3, NAD, pleasant SKIN: no rashes or lesions MSK:  - Ankles:   L ankle:  Prominent tender achilles nodule 2-3 cm proximal to insertion on calcaneous, no surrounding erythema or bruising.  Normal Thompson's test.  Slightly decreased ROM in dorsiflexion without pain, otherwise normal ROM.  Normal strength in all planes.  Neg anterior drawer & talar tilt. Able to do heel raise without significant pain R ankle: normal appearing achilles, FROM without pain.  No instability or weakness. -Feet: pes planus, prominent callous over forefoot & heel, no transverse arch collapse. - Gait: no leg length difference.  Slight over pronation with walking.  With running has good neutral strike pattern, no over pronation or supination noted.  Good running form  Neurovascularly intact distally  MSK U/S: Achilles- left achilles with thickening noted 3-4 cm proximal from insertion site, measuring 0.76 cm compared to 0.57cm on the  right.  No signs of fiber disruption or tearing.  No increased doppler flow.  Normal appearing tendon on the right.      Assessment & Plan:   Problem List as of 07/22/2010          Sports Medicine Problems   Achilles tendinitis on left   Last Assessment & Plan Note   07/22/2010 Office Visit Signed 07/22/2010  1:03 PM by Claris Che    - MSK u/s showed thickened tendon at 0.76cm without any signs of tearing or increased doppler flow - Rx for ketoprofen gel to apply qid - Start achilles HEP as outlined in patient instructions - Given heel lift for left shoe which was comfortable in the office - Ok to continue running - should start using running shoes with heel lift.  Recommend treadmill vs indoor track - f/u 4-6 weeks for re-evaluation

## 2010-07-22 NOTE — Assessment & Plan Note (Signed)
-   MSK u/s showed thickened tendon at 0.76cm without any signs of tearing or increased doppler flow - Rx for ketoprofen gel to apply qid - Start achilles HEP as outlined in patient instructions - Given heel lift for left shoe which was comfortable in the office - Ok to continue running - should start using running shoes with heel lift.  Recommend treadmill vs indoor track - f/u 4-6 weeks for re-evaluation

## 2010-07-22 NOTE — Patient Instructions (Signed)
You have achilles tendinopathy/tendinitis Take tylenol or aleve as needed for pain Apply ketoprofen gel to left achilles 3-4 times/day. Ice bucket 10-15 minutes at end of day - can ice 3-4 times a day. Start wearing felt lift in the left shoe - Heel lifts help to unload the area of pain during the day  Start wearing your other shoes for running instead of your tennis shoes May continue running if pain <3/10.  Better on treadmill than indoor track.  Avoid uneven ground, hills.  Home exercises are very important for long term recovery: 1) Begin with easy walking, heel, toe and backwards 2) Do calf raises on a step: - First lower and then raise on 1 foot If this is painful, lower on 1 foot, do the heel raise on both feet - Begin with 3 sets of 10 repetitions Increase by 5 repetitions every 3 days - Goal is 3 sets of 30 repetitions - Do with both straight knee and knee at 20 degrees of flexion  Follow-up in 4-6 weeks

## 2010-07-22 NOTE — Progress Notes (Signed)
Addended byDarene Lamer on: 07/22/2010 01:24 PM   Modules accepted: Level of Service

## 2010-11-02 ENCOUNTER — Ambulatory Visit (INDEPENDENT_AMBULATORY_CARE_PROVIDER_SITE_OTHER): Payer: BC Managed Care – PPO | Admitting: Internal Medicine

## 2010-11-02 ENCOUNTER — Encounter: Payer: Self-pay | Admitting: Internal Medicine

## 2010-11-02 VITALS — BP 120/80 | Temp 97.9°F | Wt 200.0 lb

## 2010-11-02 DIAGNOSIS — R319 Hematuria, unspecified: Secondary | ICD-10-CM

## 2010-11-02 LAB — POCT URINALYSIS DIPSTICK
Bilirubin, UA: NEGATIVE
Glucose, UA: NEGATIVE
Leukocytes, UA: NEGATIVE
Nitrite, UA: NEGATIVE

## 2010-11-02 NOTE — Progress Notes (Signed)
  Subjective:    Patient ID: Kristopher Holden, male    DOB: 1955-12-27, 55 y.o.   MRN: [id number]  HPI  55 year old patient is seen today for evaluation of possible hematuria after his wife noted some blood staining on his undergarments yesterday. The patient did notice some brief dysuria 7 days ago but has noted no change in the quality of his urine. No voiding difficulties at present. No history of kidney stones. A UA was reviewed today and was normal without hematuria    Review of Systems  Genitourinary: Positive for dysuria.       Objective:   Physical Exam  Constitutional: He appears well-developed and well-nourished. No distress.          Assessment & Plan:   Rule out hematuria. No evidence of microscopic hematuria at present. The patient did have a single episode of brief dysuria one week ago. There is concern about possible blood staining on his undergarments. It is conceivable that he may have had some gross hematuria associated with passes of a renal stone last week. The patient will simply have a repeat urine checked at his next ROV

## 2010-11-02 NOTE — Patient Instructions (Signed)
Call or return to clinic prn if these symptoms worsen or fail to improve as anticipated.

## 2010-12-31 ENCOUNTER — Other Ambulatory Visit (INDEPENDENT_AMBULATORY_CARE_PROVIDER_SITE_OTHER): Payer: BC Managed Care – PPO

## 2010-12-31 ENCOUNTER — Other Ambulatory Visit: Payer: 59 | Admitting: Internal Medicine

## 2010-12-31 DIAGNOSIS — Z0000 Encounter for general adult medical examination without abnormal findings: Secondary | ICD-10-CM

## 2010-12-31 LAB — CBC WITH DIFFERENTIAL/PLATELET
Basophils Relative: 0.7 % (ref 0.0–3.0)
Eosinophils Absolute: 0.1 10*3/uL (ref 0.0–0.7)
Eosinophils Relative: 2.3 % (ref 0.0–5.0)
Lymphocytes Relative: 29.7 % (ref 12.0–46.0)
MCHC: 33.3 g/dL (ref 30.0–36.0)
Neutrophils Relative %: 58.5 % (ref 43.0–77.0)
RBC: 4.88 Mil/uL (ref 4.22–5.81)
WBC: 6.1 10*3/uL (ref 4.5–10.5)

## 2010-12-31 LAB — HEPATIC FUNCTION PANEL
ALT: 33 U/L (ref 0–53)
AST: 29 U/L (ref 0–37)
Alkaline Phosphatase: 52 U/L (ref 39–117)
Bilirubin, Direct: 0.2 mg/dL (ref 0.0–0.3)
Total Bilirubin: 1.3 mg/dL — ABNORMAL HIGH (ref 0.3–1.2)

## 2010-12-31 LAB — BASIC METABOLIC PANEL
Chloride: 105 mEq/L (ref 96–112)
Potassium: 4.9 mEq/L (ref 3.5–5.1)

## 2010-12-31 LAB — POCT URINALYSIS DIPSTICK
Ketones, UA: NEGATIVE
Leukocytes, UA: NEGATIVE
Nitrite, UA: NEGATIVE
Protein, UA: NEGATIVE
Urobilinogen, UA: 0.2

## 2010-12-31 LAB — LIPID PANEL
LDL Cholesterol: 88 mg/dL (ref 0–99)
Total CHOL/HDL Ratio: 3

## 2011-01-07 ENCOUNTER — Encounter: Payer: 59 | Admitting: Internal Medicine

## 2011-01-07 ENCOUNTER — Other Ambulatory Visit: Payer: Self-pay | Admitting: Internal Medicine

## 2011-01-10 ENCOUNTER — Ambulatory Visit (INDEPENDENT_AMBULATORY_CARE_PROVIDER_SITE_OTHER): Payer: BC Managed Care – PPO | Admitting: Internal Medicine

## 2011-01-10 ENCOUNTER — Encounter: Payer: Self-pay | Admitting: Internal Medicine

## 2011-01-10 DIAGNOSIS — J45991 Cough variant asthma: Secondary | ICD-10-CM

## 2011-01-10 DIAGNOSIS — J302 Other seasonal allergic rhinitis: Secondary | ICD-10-CM

## 2011-01-10 DIAGNOSIS — J309 Allergic rhinitis, unspecified: Secondary | ICD-10-CM

## 2011-01-10 DIAGNOSIS — Z0000 Encounter for general adult medical examination without abnormal findings: Secondary | ICD-10-CM

## 2011-01-10 DIAGNOSIS — E785 Hyperlipidemia, unspecified: Secondary | ICD-10-CM

## 2011-01-27 ENCOUNTER — Encounter: Payer: Self-pay | Admitting: Internal Medicine

## 2011-01-27 NOTE — Progress Notes (Signed)
Subjective:    Patient ID: Kristopher Holden, male    DOB: Oct 31, 1955, 55 y.o.   MRN: [id number]  HPI  Patient presents for complete physical examination. We reviewed his health maintenance issues immunizations current medications problem list in the additional items below  His chronic ongoing problems include hyperlipidemia a history of cough variant asthma a history of osteoarthritis and degenerative osteoarthritis in the pelvis and thigh resulting in a hip resurfacing.  Acute complaints of her chronic ongoing cough.  Treatment for Achilles tendinitis on the left by sports medicine  Review of Systems  Constitutional: Negative for fever and fatigue.  HENT: Negative for hearing loss, congestion, neck pain and postnasal drip.   Eyes: Negative for discharge, redness and visual disturbance.  Respiratory: Negative for cough, shortness of breath and wheezing.   Cardiovascular: Negative for leg swelling.  Gastrointestinal: Negative for abdominal pain, constipation and abdominal distention.  Genitourinary: Negative for urgency and frequency.  Musculoskeletal: Negative for joint swelling and arthralgias.  Skin: Negative for color change and rash.  Neurological: Negative for weakness and light-headedness.  Hematological: Negative for adenopathy.  Psychiatric/Behavioral: Negative for behavioral problems.   Past Medical History  Diagnosis Date  . Hyperlipidemia   . Osteoarthritis    Past Surgical History  Procedure Date  . Total hip resurface      right    reports that he has never smoked. He does not have any smokeless tobacco history on file. He reports that he drinks alcohol. He reports that he does not use illicit drugs. family history includes Cancer in his mother and Diabetes in his maternal aunt. Allergies  Allergen Reactions  . Penicillins     REACTION: ? childhood       Objective:   Physical Exam  Nursing note and vitals reviewed. Constitutional: He is oriented to  person, place, and time. He appears well-developed and well-nourished.  HENT:  Head: Normocephalic and atraumatic.  Eyes: Conjunctivae are normal. Pupils are equal, round, and reactive to light.  Neck: Normal range of motion. Neck supple.  Cardiovascular: Normal rate and regular rhythm.   Pulmonary/Chest: Effort normal and breath sounds normal.  Abdominal: Soft. Bowel sounds are normal.  Genitourinary: Prostate normal.  Musculoskeletal: Normal range of motion.  Neurological: He is alert and oriented to person, place, and time.  Skin: Skin is warm and dry.  Psychiatric: He has a normal mood and affect. His behavior is normal.          Assessment & Plan:   Patient presents for yearly preventative medicine examination.   all immunizations and health maintenance protocols were reviewed with the patient and they are up to date with these protocols.   screening laboratory values were reviewed with the patient including screening of hyperlipidemia PSA renal function and hepatic function.   There medications past medical history social history problem list and allergies were reviewed in detail.   Goals were established with regard to weight loss exercise diet in compliance with medications   Patient no longer runs but he does exercise on regular basis his hip resurfacing appears to be doing well.  His hyperlipidemia is moderately well controlled but the decreased activity has resulted in a slight increase in his cholesterol we discussed strategies to get his cholesterol in better alignment with our goals.  His chronic cough and cough. Asthma are primarily allergy mediated this time he is encouraged to start his spinal cord Ladona Ridgel prior to the onset of the fall allergy season we will  see him in 6 months

## 2011-01-27 NOTE — Patient Instructions (Signed)
Patient was instructed to continue all medications as prescribed. To stop at the checkout desk and schedule a followup appointment  

## 2011-04-04 ENCOUNTER — Telehealth: Payer: Self-pay | Admitting: Family Medicine

## 2011-04-04 NOTE — Telephone Encounter (Signed)
Triage Record Num: [id number] Operator: Migdalia Dk Patient Name: Kristopher Holden Call Date & Time: 04/02/2011 10:35:56PM Patient Phone: [phone] PCP: Darryll Capers Patient Gender: Male PCP Fax : [phone] Patient DOB: November 11, 1955 [facility] Name[facility] Reason for Call: Caller: Margaret/Spouse; PCP: Darryll Capers; CB#: [phone]; Call Reason: Vomiting; Sx Onset: 04/02/2011; Sx Notes: Started with vomiting around 1600 today, "I drank some Ginger Ale but thats not staying down." Unable to keep anything down since 1600. Took Phenergan supp around 2010 without relief. Now with dizziness and continued nausea. No diarrhea.; Afebrile; ; Home treatment(s) tried: Ginger Ale treatments, Phenergan 25mg  suppository; Did home treatment help?: No; Guideline Used:Nausea or Vomiting ; Disp:Homecare; Appt Scheduled?: no Protocol(s) Used: Nausea or Vomiting Recommended Outcome per Protocol: Provide Home/Self Care Reason for Outcome: New onset of 3-4 episodes vomiting or diarrhea following mild abdominal cramping Care Advice: Go to the ED if you have developed signs and symptoms of dehydration such as very dry mouth and tongue; increased pulse rate at rest; no urine output for 8 hours or more; increasing weakness or drowsiness, or lightheadedness when trying to sit upright or standing. ~ Vomiting and Diarrhea Care: - Do not eat solid foods until vomiting subsides. - Begin taking fluids by sucking on ice chips or popsicles or taking sips of cool, clear fluids (soda, fruit juices that are low acid, sports drinks or nonprescription oral rehydration solution). - Gradually drink larger amounts of these fluids so that you are drinking six to eight 8 oz. (1.2 to 1.6 liters) of fluids a day. - Keep activity to a minimum. - Once vomiting and diarrhea subside, eat smaller, more frequent meals of easily digested foods such as crackers, toast, bananas, rice, cooked cereal, applesauce, broth, baked or  mashed potatoes, chicken or [place] without skin. Eat slowly. - Take fluids 30 minutes before or 60 minutes after meals. - Avoid high fat, highly seasoned, high fiber or high sugar content foods. - Avoid extremely hot or cold foods. - Do not take pain medication (such as aspirin, NSAIDs) while nauseated or vomiting. - Do not drink caffeinated or alcoholic beverages. ~

## 2011-06-13 ENCOUNTER — Other Ambulatory Visit: Payer: Self-pay | Admitting: *Deleted

## 2011-06-13 MED ORDER — BUDESONIDE 180 MCG/ACT IN AEPB: 1.0000 | INHALATION_SPRAY | RESPIRATORY_TRACT | Status: DC

## 2011-06-14 ENCOUNTER — Other Ambulatory Visit: Payer: Self-pay | Admitting: Internal Medicine

## 2011-06-15 ENCOUNTER — Other Ambulatory Visit: Payer: Self-pay | Admitting: *Deleted

## 2011-06-15 MED ORDER — BUDESONIDE 180 MCG/ACT IN AEPB: 2.0000 | INHALATION_SPRAY | Freq: Two times a day (BID) | RESPIRATORY_TRACT | Status: DC

## 2011-07-04 ENCOUNTER — Other Ambulatory Visit (INDEPENDENT_AMBULATORY_CARE_PROVIDER_SITE_OTHER): Payer: BC Managed Care – PPO

## 2011-07-04 DIAGNOSIS — E785 Hyperlipidemia, unspecified: Secondary | ICD-10-CM

## 2011-07-04 DIAGNOSIS — Z0000 Encounter for general adult medical examination without abnormal findings: Secondary | ICD-10-CM

## 2011-07-04 LAB — LIPID PANEL
LDL Cholesterol: 91 mg/dL (ref 0–99)
Total CHOL/HDL Ratio: 3

## 2011-07-04 LAB — HEPATIC FUNCTION PANEL
ALT: 26 U/L (ref 0–53)
AST: 25 U/L (ref 0–37)
Alkaline Phosphatase: 44 U/L (ref 39–117)
Bilirubin, Direct: 0.1 mg/dL (ref 0.0–0.3)
Total Bilirubin: 0.9 mg/dL (ref 0.3–1.2)

## 2011-07-11 ENCOUNTER — Ambulatory Visit (INDEPENDENT_AMBULATORY_CARE_PROVIDER_SITE_OTHER): Payer: BC Managed Care – PPO | Admitting: Internal Medicine

## 2011-07-11 ENCOUNTER — Encounter: Payer: Self-pay | Admitting: Internal Medicine

## 2011-07-11 VITALS — BP 110/70 | HR 72 | Temp 98.3°F | Resp 16 | Ht 69.75 in | Wt 200.0 lb

## 2011-07-11 DIAGNOSIS — Z0000 Encounter for general adult medical examination without abnormal findings: Secondary | ICD-10-CM

## 2011-07-11 DIAGNOSIS — J45909 Unspecified asthma, uncomplicated: Secondary | ICD-10-CM

## 2011-07-11 DIAGNOSIS — R110 Nausea: Secondary | ICD-10-CM

## 2011-07-11 MED ORDER — BECLOMETHASONE DIPROPIONATE 80 MCG/ACT IN AERS: 1.0000 | INHALATION_SPRAY | RESPIRATORY_TRACT | Status: DC | PRN

## 2011-07-11 MED ORDER — PROMETHAZINE HCL 25 MG RE SUPP: 25.0000 mg | Freq: Four times a day (QID) | RECTAL | Status: AC | PRN

## 2011-07-11 NOTE — Progress Notes (Signed)
Addended by: Stacie Glaze on: 07/11/2011 09:04 AM   Modules accepted: Orders

## 2011-07-11 NOTE — Progress Notes (Signed)
Subjective:    Patient ID: Kristopher Holden, male    DOB: [birth year], 56 y.o.   MRN: [id number]  HPI Switched to generic Lipitor Patient is currently on asthma inhaler called Pulmicort. He had been stable this medication but his formulary has changed in the co-pay has significantly increased so we went to do a trial of a different maintenance drug. We will use Qvar 80. Patient also experienced double vision he was evaluated by a ophthalmologist and was found to have a high muscle weakness syndrome he is wearing special glasses to correct that weakness.      Review of Systems  Constitutional: Negative for fever and fatigue.  HENT: Negative for hearing loss, congestion, neck pain and postnasal drip.   Eyes: Negative for discharge, redness and visual disturbance.  Respiratory: Negative for cough, shortness of breath and wheezing.   Cardiovascular: Negative for leg swelling.  Gastrointestinal: Negative for abdominal pain, constipation and abdominal distention.  Genitourinary: Negative for urgency and frequency.  Musculoskeletal: Negative for joint swelling and arthralgias.  Skin: Negative for color change and rash.  Neurological: Negative for weakness and light-headedness.  Hematological: Negative for adenopathy.  Psychiatric/Behavioral: Negative for behavioral problems.   Past Medical History  Diagnosis Date  . Hyperlipidemia   . Osteoarthritis     History   Social History  . Marital Status: Married    Spouse Name: N/A    Number of Children: N/A  . Years of Education: N/A   Occupational History  . Not on file.   Social History Main Topics  . Smoking status: Never Smoker   . Smokeless tobacco: Not on file  . Alcohol Use: Yes  . Drug Use: No  . Sexually Active: Yes   Other Topics Concern  . Not on file   Social History Narrative  . No narrative on file    Past Surgical History  Procedure Date  . Total hip resurface      right    Family History  Problem  Relation Age of Onset  . Cancer Mother     lung  . Diabetes Maternal Aunt     Allergies  Allergen Reactions  . Penicillins     REACTION: ? childhood    Current Outpatient Prescriptions on File Prior to Visit  Medication Sig Dispense Refill  . aspirin 81 MG tablet Take 81 mg by mouth daily.        . fish oil-omega-3 fatty acids 1000 MG capsule Take 2 g by mouth 2 (two) times daily. Take 2 bid       . LIPITOR 20 MG tablet TAKE 1 TABLET ONCE DAILY.  30 each  5  . Multiple Vitamins-Minerals (MEGA MULTI MEN PO) Take 1 tablet by mouth daily.          BP 110/70  Pulse 72  Temp 98.3 F (36.8 C)  Resp 16  Ht 5' 9.75" (1.772 m)  Wt 200 lb (90.719 kg)  BMI 28.90 kg/m2       Objective:   Physical Exam  Nursing note and vitals reviewed. Constitutional: He appears well-developed and well-nourished.  HENT:  Head: Normocephalic and atraumatic.  Eyes: Conjunctivae are normal. Pupils are equal, round, and reactive to light.  Neck: Normal range of motion. Neck supple.  Cardiovascular: Normal rate and regular rhythm.   Pulmonary/Chest: Effort normal and breath sounds normal.  Abdominal: Soft. Bowel sounds are normal.          Assessment & Plan:  Motivations issues? Early  depression Asthma. Change inhaler for maintenance inhaler Sleep issues and noturia Lipid monitoring stable.

## 2011-07-11 NOTE — Patient Instructions (Addendum)
The patient is instructed to continue all medications as prescribed. Schedule followup with check out clerk upon leaving the clinic   Saw palmeto  OTC at [facility] Melatonin 3 mg at bed time.

## 2011-07-12 ENCOUNTER — Other Ambulatory Visit: Payer: Self-pay | Admitting: *Deleted

## 2011-08-11 ENCOUNTER — Other Ambulatory Visit: Payer: Self-pay | Admitting: Internal Medicine

## 2011-08-23 ENCOUNTER — Encounter: Payer: Self-pay | Admitting: Internal Medicine

## 2011-09-06 ENCOUNTER — Telehealth: Payer: Self-pay | Admitting: Internal Medicine

## 2011-09-06 NOTE — Telephone Encounter (Signed)
Left message for pt to call back  °

## 2011-09-06 NOTE — Telephone Encounter (Signed)
Pt notified that his last colon was done in 2006 and it stated that he would have a recall in 7 years. Pt instructed to call if any further questions.

## 2011-11-09 ENCOUNTER — Other Ambulatory Visit: Payer: Self-pay | Admitting: *Deleted

## 2011-11-09 ENCOUNTER — Telehealth: Payer: Self-pay | Admitting: *Deleted

## 2011-11-09 MED ORDER — ESZOPICLONE 2 MG PO TABS: 2.0000 mg | ORAL_TABLET | Freq: Every day | ORAL | Status: DC

## 2011-11-09 MED ORDER — BUDESONIDE 180 MCG/ACT IN AEPB: 1.0000 | INHALATION_SPRAY | Freq: Two times a day (BID) | RESPIRATORY_TRACT | Status: DC

## 2011-11-09 NOTE — Telephone Encounter (Signed)
Pt Left message on machine Requesting medication for sleep, and requesting to start back on pulmocort again

## 2011-11-14 ENCOUNTER — Encounter: Payer: Self-pay | Admitting: Internal Medicine

## 2011-11-14 ENCOUNTER — Ambulatory Visit (INDEPENDENT_AMBULATORY_CARE_PROVIDER_SITE_OTHER): Payer: BC Managed Care – PPO | Admitting: Internal Medicine

## 2011-11-14 VITALS — BP 130/70 | HR 72 | Temp 98.6°F | Resp 16 | Ht 67.5 in | Wt 188.0 lb

## 2011-11-14 DIAGNOSIS — F439 Reaction to severe stress, unspecified: Secondary | ICD-10-CM

## 2011-11-14 DIAGNOSIS — F438 Other reactions to severe stress: Secondary | ICD-10-CM

## 2011-11-14 NOTE — Patient Instructions (Signed)
The patient is instructed to continue all medications as prescribed. Schedule followup with check out clerk upon leaving the clinic  

## 2011-11-17 ENCOUNTER — Ambulatory Visit (INDEPENDENT_AMBULATORY_CARE_PROVIDER_SITE_OTHER): Payer: BC Managed Care – PPO | Admitting: Psychology

## 2011-11-17 DIAGNOSIS — F4323 Adjustment disorder with mixed anxiety and depressed mood: Secondary | ICD-10-CM

## 2011-11-21 ENCOUNTER — Ambulatory Visit (INDEPENDENT_AMBULATORY_CARE_PROVIDER_SITE_OTHER): Payer: BC Managed Care – PPO | Admitting: Psychology

## 2011-11-21 DIAGNOSIS — F4323 Adjustment disorder with mixed anxiety and depressed mood: Secondary | ICD-10-CM

## 2011-11-28 ENCOUNTER — Ambulatory Visit (INDEPENDENT_AMBULATORY_CARE_PROVIDER_SITE_OTHER): Payer: BC Managed Care – PPO | Admitting: Psychology

## 2011-11-28 DIAGNOSIS — F4323 Adjustment disorder with mixed anxiety and depressed mood: Secondary | ICD-10-CM

## 2011-12-05 ENCOUNTER — Ambulatory Visit (INDEPENDENT_AMBULATORY_CARE_PROVIDER_SITE_OTHER): Payer: BC Managed Care – PPO | Admitting: Psychology

## 2011-12-05 DIAGNOSIS — F4323 Adjustment disorder with mixed anxiety and depressed mood: Secondary | ICD-10-CM

## 2011-12-13 ENCOUNTER — Ambulatory Visit (INDEPENDENT_AMBULATORY_CARE_PROVIDER_SITE_OTHER): Payer: BC Managed Care – PPO | Admitting: Psychology

## 2011-12-13 DIAGNOSIS — F4323 Adjustment disorder with mixed anxiety and depressed mood: Secondary | ICD-10-CM

## 2011-12-14 ENCOUNTER — Ambulatory Visit (INDEPENDENT_AMBULATORY_CARE_PROVIDER_SITE_OTHER): Payer: BC Managed Care – PPO | Admitting: Psychology

## 2011-12-14 DIAGNOSIS — F4323 Adjustment disorder with mixed anxiety and depressed mood: Secondary | ICD-10-CM

## 2011-12-15 ENCOUNTER — Ambulatory Visit (INDEPENDENT_AMBULATORY_CARE_PROVIDER_SITE_OTHER): Payer: BC Managed Care – PPO | Admitting: Psychology

## 2011-12-15 DIAGNOSIS — F4323 Adjustment disorder with mixed anxiety and depressed mood: Secondary | ICD-10-CM

## 2011-12-19 ENCOUNTER — Ambulatory Visit (INDEPENDENT_AMBULATORY_CARE_PROVIDER_SITE_OTHER): Payer: BC Managed Care – PPO | Admitting: Psychology

## 2011-12-19 DIAGNOSIS — F4323 Adjustment disorder with mixed anxiety and depressed mood: Secondary | ICD-10-CM

## 2011-12-26 ENCOUNTER — Ambulatory Visit (INDEPENDENT_AMBULATORY_CARE_PROVIDER_SITE_OTHER): Payer: BC Managed Care – PPO | Admitting: Psychology

## 2011-12-26 DIAGNOSIS — F4323 Adjustment disorder with mixed anxiety and depressed mood: Secondary | ICD-10-CM

## 2011-12-27 ENCOUNTER — Ambulatory Visit: Payer: BC Managed Care – PPO | Admitting: Psychology

## 2012-01-04 ENCOUNTER — Other Ambulatory Visit: Payer: BC Managed Care – PPO

## 2012-01-04 ENCOUNTER — Other Ambulatory Visit: Payer: Self-pay | Admitting: Internal Medicine

## 2012-01-04 DIAGNOSIS — Z0000 Encounter for general adult medical examination without abnormal findings: Secondary | ICD-10-CM

## 2012-01-05 ENCOUNTER — Other Ambulatory Visit (INDEPENDENT_AMBULATORY_CARE_PROVIDER_SITE_OTHER): Payer: BC Managed Care – PPO

## 2012-01-05 DIAGNOSIS — Z0000 Encounter for general adult medical examination without abnormal findings: Secondary | ICD-10-CM

## 2012-01-05 LAB — BASIC METABOLIC PANEL
CO2: 29 mEq/L (ref 19–32)
Chloride: 106 mEq/L (ref 96–112)
Sodium: 141 mEq/L (ref 135–145)

## 2012-01-05 LAB — LIPID PANEL
Cholesterol: 165 mg/dL (ref 0–200)
HDL: 53.3 mg/dL (ref >39.00)
LDL Cholesterol: 96 mg/dL (ref 0–99)
Total CHOL/HDL Ratio: 3
Triglycerides: 81 mg/dL (ref 0.0–149.0)

## 2012-01-05 LAB — HEPATIC FUNCTION PANEL
ALT: 45 U/L (ref 0–53)
AST: 43 U/L — ABNORMAL HIGH (ref 0–37)
Bilirubin, Direct: 0.2 mg/dL (ref 0.0–0.3)
Total Protein: 6.5 g/dL (ref 6.0–8.3)

## 2012-01-10 ENCOUNTER — Ambulatory Visit (INDEPENDENT_AMBULATORY_CARE_PROVIDER_SITE_OTHER): Payer: BC Managed Care – PPO | Admitting: Psychology

## 2012-01-10 DIAGNOSIS — F4323 Adjustment disorder with mixed anxiety and depressed mood: Secondary | ICD-10-CM

## 2012-01-13 ENCOUNTER — Encounter: Payer: BC Managed Care – PPO | Admitting: Internal Medicine

## 2012-01-17 ENCOUNTER — Ambulatory Visit (INDEPENDENT_AMBULATORY_CARE_PROVIDER_SITE_OTHER): Payer: BC Managed Care – PPO | Admitting: Internal Medicine

## 2012-01-17 ENCOUNTER — Encounter: Payer: Self-pay | Admitting: Internal Medicine

## 2012-01-17 ENCOUNTER — Ambulatory Visit (INDEPENDENT_AMBULATORY_CARE_PROVIDER_SITE_OTHER): Payer: BC Managed Care – PPO | Admitting: Psychology

## 2012-01-17 VITALS — BP 110/70 | HR 72 | Temp 98.2°F | Resp 16 | Ht 67.5 in | Wt 188.0 lb

## 2012-01-17 DIAGNOSIS — F4323 Adjustment disorder with mixed anxiety and depressed mood: Secondary | ICD-10-CM

## 2012-01-17 DIAGNOSIS — Z23 Encounter for immunization: Secondary | ICD-10-CM

## 2012-01-17 DIAGNOSIS — F430 Acute stress reaction: Secondary | ICD-10-CM

## 2012-01-17 DIAGNOSIS — F438 Other reactions to severe stress: Secondary | ICD-10-CM

## 2012-01-17 DIAGNOSIS — R110 Nausea: Secondary | ICD-10-CM

## 2012-01-23 ENCOUNTER — Ambulatory Visit: Payer: BC Managed Care – PPO | Admitting: Psychology

## 2012-01-23 NOTE — Progress Notes (Signed)
  Subjective:    Patient ID: Kristopher Holden, male    DOB: October 29, 1955, 56 y.o.   MRN: [id number]  HPI Patient is a 56 year old male who presents for an office visit to discuss increased stressors in his life and the resultant effect on his sense of well-being.  We spent 30 minutes discussing his domestic situation and its impact on his point of view his ability to make decisions and his stress levels.  Our clear recommendations were to make an effort to proceed to joint counseling  And to focus more on the relationship as a whole rather than specific metrics. We have recommended that both parties address why they want to remain in the relationship and seek to activate those reasons .   Review of Systems    nan Objective:   Physical Exam   a     Assessment & Plan:   I have spent more than 30 minutes examining this patient face-to-face of which over half was spent in counseling stress and anxiety counseling

## 2012-01-24 NOTE — Progress Notes (Signed)
  Subjective:    Patient ID: Kristopher Holden, male    DOB: Nov 28, 1955, 56 y.o.   MRN: [id number]  HPI  Patient requested counseling regarding stress in his life.  Review of Systems  Constitutional: Negative for fever and fatigue.  HENT: Negative for hearing loss, congestion, neck pain and postnasal drip.   Eyes: Negative for discharge, redness and visual disturbance.  Respiratory: Negative for cough, shortness of breath and wheezing.   Cardiovascular: Negative for leg swelling.  Gastrointestinal: Negative for abdominal pain, constipation and abdominal distention.  Genitourinary: Negative for urgency and frequency.  Musculoskeletal: Negative for joint swelling and arthralgias.  Skin: Negative for color change and rash.  Neurological: Negative for weakness and light-headedness.  Hematological: Negative for adenopathy.  Psychiatric/Behavioral: Negative for behavioral problems.   increased stressors involving family issues resulting in anger frustration     Objective:   Physical Exam  Constitutional: He appears well-developed and well-nourished.  HENT:  Head: Normocephalic and atraumatic.  Eyes: Conjunctivae normal are normal. Pupils are equal, round, and reactive to light.          Assessment & Plan:  Discussed relaxation stress reduction and referred for counseling to psychology  I have spent more than 30 minutes examining this patient face-to-face of which over half was spent in counseling

## 2012-02-15 ENCOUNTER — Ambulatory Visit (INDEPENDENT_AMBULATORY_CARE_PROVIDER_SITE_OTHER): Payer: BC Managed Care – PPO | Admitting: Psychology

## 2012-02-15 DIAGNOSIS — F4323 Adjustment disorder with mixed anxiety and depressed mood: Secondary | ICD-10-CM

## 2012-03-07 ENCOUNTER — Ambulatory Visit (INDEPENDENT_AMBULATORY_CARE_PROVIDER_SITE_OTHER): Payer: BC Managed Care – PPO | Admitting: Psychology

## 2012-03-07 DIAGNOSIS — F4323 Adjustment disorder with mixed anxiety and depressed mood: Secondary | ICD-10-CM

## 2012-03-28 ENCOUNTER — Telehealth: Payer: Self-pay | Admitting: Internal Medicine

## 2012-03-28 MED ORDER — ESTAZOLAM 2 MG PO TABS: 2.0000 mg | ORAL_TABLET | Freq: Every evening | ORAL | Status: DC | PRN

## 2012-03-28 NOTE — Telephone Encounter (Signed)
Patient calling per the instructions from [facility].  They told him that they have contacted the office several times this week to get a refill on his sleep aide but haven't gotten a response.  States that he only took 1/2 tab very prn when it was filled in July.  Asking for a refill to be called to [facility].

## 2012-03-28 NOTE — Telephone Encounter (Signed)
Called to pharmacy ,but we have not received any refill request

## 2012-04-02 ENCOUNTER — Encounter: Payer: BC Managed Care – PPO | Admitting: Internal Medicine

## 2012-04-03 ENCOUNTER — Ambulatory Visit: Payer: BC Managed Care – PPO | Admitting: Family Medicine

## 2012-04-04 ENCOUNTER — Ambulatory Visit (INDEPENDENT_AMBULATORY_CARE_PROVIDER_SITE_OTHER): Payer: BC Managed Care – PPO | Admitting: Family Medicine

## 2012-04-04 VITALS — BP 117/73 | Ht 70.0 in | Wt 180.0 lb

## 2012-04-04 DIAGNOSIS — M7661 Achilles tendinitis, right leg: Secondary | ICD-10-CM

## 2012-04-04 DIAGNOSIS — M7660 Achilles tendinitis, unspecified leg: Secondary | ICD-10-CM

## 2012-04-04 NOTE — Patient Instructions (Addendum)
You have right achilles tendinopathy Tylenol or aleve as needed for pain Lowering/raise on a step exercises 3 x 15 once a day as I showed you - start with two feet first then when it feels comfortable enough you can do only on the right side 3 x 15 once a day. Can add heel walks, toe walks forward and backward as well. Ice 3-4 times a day as needed for pain. Avoid uneven ground, hills as much as possible. Cross train with [employer], biking, elliptical, swimming for next couple weeks. Heel lifts - switch these in and out of your shoes. OTC orthotics with heel lift Custom orthotics may be helpful Consider physical therapy, nitro patches if not improving as expected. Follow up with me in 1 month. It will likely be around 3-4 weeks before you're back to your old routine.  Work up slowly to this as we discussed - either a walk:jog program or drop your distance to about 1 mile total and jog at half your usual pace, work up from there every other day.

## 2012-04-10 ENCOUNTER — Encounter: Payer: Self-pay | Admitting: Family Medicine

## 2012-04-10 DIAGNOSIS — M7661 Achilles tendinitis, right leg: Secondary | ICD-10-CM | POA: Insufficient documentation

## 2012-04-10 NOTE — Assessment & Plan Note (Signed)
Right achilles tendinopathy - Demonstrated home rehab protocol and handout provided.  Heel lifts, icing, avoid uneven ground and hills as much as possible.  Cross train.  Discussed orthotics, tylenol/nsaids as needed.  Consider nitro patches, formal PT if not improving.  F/u in 1 month.  Discussed return to running protocol.

## 2012-04-10 NOTE — Progress Notes (Addendum)
Subjective:    Patient ID: Kristopher Holden, male    DOB: May 06, 1955, 56 y.o.   MRN: [id number]  56 yo M here for right achilles/calf pain  HPI Patient reports he usually runs up to about a 5K distance. Usually runs on a treadmill or track. More recently has been running outside. Bought new shoes about 8 weeks ago. States a week ago a mile into a 4 mile trail run he developed achilles pain on right side. Went to an orthopedic clinic and was given heel lifts and voltaren gel - not a lot of benefit with these to date. Unable to run since then.  Past Medical History  Diagnosis Date  . Hyperlipidemia   . Osteoarthritis     Current Outpatient Prescriptions on File Prior to Visit  Medication Sig Dispense Refill  . aspirin 81 MG tablet Take 81 mg by mouth daily.        Marland Kitchen estazolam (PROSOM) 2 MG tablet Take 1 tablet (2 mg total) by mouth at bedtime as needed.  30 tablet  5  . LIPITOR 20 MG tablet TAKE 1 TABLET ONCE DAILY.  30 each  11  . Multiple Vitamins-Minerals (MEGA MULTI MEN PO) Take 1 tablet by mouth daily.        . beclomethasone (QVAR) 80 MCG/ACT inhaler Inhale 1 puff into the lungs as needed.  1 Inhaler  12  . budesonide (PULMICORT FLEXHALER) 180 MCG/ACT inhaler Inhale 1 puff into the lungs 2 (two) times daily.  1 each  3  . fish oil-omega-3 fatty acids 1000 MG capsule Take 2 g by mouth 2 (two) times daily. Take 2 bid         Past Surgical History  Procedure Date  . Total hip resurface      right  . Knee surgery 1991    partial acl tear reconstruction    Allergies  Allergen Reactions  . Penicillins     REACTION: ? childhood    History   Social History  . Marital Status: Married    Spouse Name: N/A    Number of Children: N/A  . Years of Education: N/A   Occupational History  . Not on file.   Social History Main Topics  . Smoking status: Never Smoker   . Smokeless tobacco: Not on file  . Alcohol Use: Yes  . Drug Use: No  . Sexually Active: Yes   Other  Topics Concern  . Not on file   Social History Narrative  . No narrative on file    Family History  Problem Relation Age of Onset  . Lung cancer Mother   . Hyperlipidemia Mother   . Diabetes Maternal Aunt   . Heart attack Father   . Hypertension Neg Hx     BP 117/73  Ht 5\' 10"  (1.778 m)  Wt 180 lb (81.647 kg)  BMI 25.83 kg/m2  Review of Systems See HPI above.    Objective:   Physical Exam Gen: NAD  R ankle/leg: No gross deformity, swelling, ecchymoses. FROM - able to do 10 calf raises with mild pain in achilles. TTP 2 cm proximal to achilles insertion on calcaneus. No other TTP about foot/ankle. Negative ant drawer and talar tilt.   Negative syndesmotic compression. Thompsons test negative. NV intact distally.    Assessment & Plan:  1. Right achilles tendinopathy - Demonstrated home rehab protocol and handout provided.  Heel lifts, icing, avoid uneven ground and hills as much as possible.  Cross train.  Discussed orthotics, tylenol/nsaids as needed.  Consider nitro patches, formal PT if not improving.  F/u in 1 month.  Discussed return to running protocol.  Addendum:  Patient requested topical voltaren gel - given sample previously which helped.  He had tried oral advil regularly without relief per his report.  Will put in authorization form.  Advised him this may not be approved - could consider ketoprofen gel if not approved.  Addendum:  Insurance denied voltaren gel.  Will send in ketoprofen gel instead - faxed to [facility].

## 2012-04-11 ENCOUNTER — Ambulatory Visit: Payer: BC Managed Care – PPO | Admitting: Sports Medicine

## 2012-04-30 ENCOUNTER — Encounter: Payer: Self-pay | Admitting: Internal Medicine

## 2012-04-30 ENCOUNTER — Ambulatory Visit (INDEPENDENT_AMBULATORY_CARE_PROVIDER_SITE_OTHER): Payer: BC Managed Care – PPO | Admitting: Internal Medicine

## 2012-04-30 VITALS — BP 124/70 | HR 64 | Temp 97.6°F | Resp 16 | Ht 70.0 in | Wt 192.0 lb

## 2012-04-30 DIAGNOSIS — R7989 Other specified abnormal findings of blood chemistry: Secondary | ICD-10-CM

## 2012-04-30 DIAGNOSIS — Z0000 Encounter for general adult medical examination without abnormal findings: Secondary | ICD-10-CM

## 2012-04-30 DIAGNOSIS — E785 Hyperlipidemia, unspecified: Secondary | ICD-10-CM

## 2012-04-30 LAB — HEPATIC FUNCTION PANEL
Bilirubin, Direct: 0.1 mg/dL (ref 0.0–0.3)
Total Bilirubin: 1.6 mg/dL — ABNORMAL HIGH (ref 0.3–1.2)
Total Protein: 7.4 g/dL (ref 6.0–8.3)

## 2012-04-30 NOTE — Patient Instructions (Addendum)
The patient is instructed to continue all medications as prescribed. Schedule followup with check out clerk upon leaving the clinic  

## 2012-04-30 NOTE — Progress Notes (Signed)
Subjective:    Patient ID: Kristopher Holden, male    DOB: Jun 22, 1955, 56 y.o.   MRN: [id number]  HPI CPX Increased stress Reviewed labs from 3 months Needs CBC and repeat liver    Review of Systems  Constitutional: Negative for fever and fatigue.  HENT: Negative for hearing loss, congestion, neck pain and postnasal drip.   Eyes: Negative for discharge, redness and visual disturbance.  Respiratory: Negative for cough, shortness of breath and wheezing.   Cardiovascular: Negative for leg swelling.  Gastrointestinal: Negative for abdominal pain, constipation and abdominal distention.  Genitourinary: Negative for urgency and frequency.  Musculoskeletal: Negative for joint swelling and arthralgias.  Skin: Negative for color change and rash.  Neurological: Negative for weakness and light-headedness.  Hematological: Negative for adenopathy.  Psychiatric/Behavioral: Positive for sleep disturbance. Negative for behavioral problems.   Past Medical History  Diagnosis Date  . Hyperlipidemia   . Osteoarthritis     History   Social History  . Marital Status: Married    Spouse Name: N/A    Number of Children: N/A  . Years of Education: N/A   Occupational History  . Not on file.   Social History Main Topics  . Smoking status: Never Smoker   . Smokeless tobacco: Not on file  . Alcohol Use: Yes  . Drug Use: No  . Sexually Active: Yes   Other Topics Concern  . Not on file   Social History Narrative  . No narrative on file    Past Surgical History  Procedure Date  . Total hip resurface      right  . Knee surgery 1991    partial acl tear reconstruction    Family History  Problem Relation Age of Onset  . Lung cancer Mother   . Hyperlipidemia Mother   . Diabetes Maternal Aunt   . Heart attack Father   . Hypertension Neg Hx     Allergies  Allergen Reactions  . Penicillins     REACTION: ? childhood    Current Outpatient Prescriptions on File Prior to Visit    Medication Sig Dispense Refill  . aspirin 81 MG tablet Take 81 mg by mouth daily.        . beclomethasone (QVAR) 80 MCG/ACT inhaler Inhale 1 puff into the lungs as needed.  1 Inhaler  12  . budesonide (PULMICORT FLEXHALER) 180 MCG/ACT inhaler Inhale 1 puff into the lungs 2 (two) times daily.  1 each  3  . estazolam (PROSOM) 2 MG tablet Take 1 tablet (2 mg total) by mouth at bedtime as needed.  30 tablet  5  . fish oil-omega-3 fatty acids 1000 MG capsule Take 2 g by mouth 2 (two) times daily. Take 2 bid       . LIPITOR 20 MG tablet TAKE 1 TABLET ONCE DAILY.  30 each  11  . Multiple Vitamins-Minerals (MEGA MULTI MEN PO) Take 1 tablet by mouth daily.          BP 124/70  Pulse 64  Temp 97.6 F (36.4 C)  Resp 16  Ht 5\' 10"  (1.778 m)  Wt 192 lb (87.091 kg)  BMI 27.55 kg/m2       Objective:   Physical Exam  Nursing note and vitals reviewed. Constitutional: He is oriented to person, place, and time. He appears well-developed and well-nourished.  HENT:  Head: Normocephalic and atraumatic.  Eyes: Conjunctivae normal are normal. Pupils are equal, round, and reactive to light.  Neck: Normal range of  motion. Neck supple.  Cardiovascular: Normal rate and regular rhythm.   Pulmonary/Chest: Effort normal and breath sounds normal.  Abdominal: Soft. Bowel sounds are normal.  Genitourinary: Rectum normal and prostate normal.  Musculoskeletal: Normal range of motion.  Neurological: He is alert and oriented to person, place, and time.  Skin: Skin is warm and dry.  Psychiatric: He has a normal mood and affect. His behavior is normal.          Assessment & Plan:   Patient presents for yearly preventative medicine examination.   all immunizations and health maintenance protocols were reviewed with the patient and they are up to date with these protocols.   screening laboratory values were reviewed with the patient including screening of hyperlipidemia PSA renal function and hepatic  function.   There medications past medical history social history problem list and allergies were reviewed in detail.   Goals were established with regard to weight loss exercise diet in compliance with medications  Elevated liver functions repeat PSA change discussed Inactive sexually

## 2012-05-01 LAB — CBC WITH DIFFERENTIAL/PLATELET
Basophils Relative: 0.6 % (ref 0.0–3.0)
Eosinophils Absolute: 0.1 10*3/uL (ref 0.0–0.7)
Eosinophils Relative: 1.9 % (ref 0.0–5.0)
Hemoglobin: 14.9 g/dL (ref 13.0–17.0)
Lymphocytes Relative: 27 % (ref 12.0–46.0)
MCHC: 34 g/dL (ref 30.0–36.0)
Neutro Abs: 4.4 10*3/uL (ref 1.4–7.7)
Neutrophils Relative %: 62.7 % (ref 43.0–77.0)
RBC: 4.71 Mil/uL (ref 4.22–5.81)
WBC: 7.1 10*3/uL (ref 4.5–10.5)

## 2012-05-04 ENCOUNTER — Ambulatory Visit (INDEPENDENT_AMBULATORY_CARE_PROVIDER_SITE_OTHER): Payer: BC Managed Care – PPO | Admitting: Psychology

## 2012-05-04 DIAGNOSIS — F4323 Adjustment disorder with mixed anxiety and depressed mood: Secondary | ICD-10-CM

## 2012-05-14 MED ORDER — DICLOFENAC SODIUM 1 % TD GEL: 4.0000 g | Freq: Four times a day (QID) | TRANSDERMAL | Status: DC

## 2012-05-14 NOTE — Addendum Note (Signed)
Addended by: Norton Blizzard R on: 05/14/2012 10:20 AM   Modules accepted: Orders

## 2012-06-25 ENCOUNTER — Encounter: Payer: BC Managed Care – PPO | Admitting: Internal Medicine

## 2012-08-08 ENCOUNTER — Encounter: Payer: Self-pay | Admitting: Internal Medicine

## 2012-08-29 ENCOUNTER — Other Ambulatory Visit: Payer: Self-pay | Admitting: Internal Medicine

## 2012-09-05 ENCOUNTER — Ambulatory Visit: Payer: BC Managed Care – PPO | Admitting: Psychology

## 2012-09-06 ENCOUNTER — Other Ambulatory Visit: Payer: Self-pay | Admitting: Internal Medicine

## 2012-09-06 ENCOUNTER — Ambulatory Visit: Payer: BC Managed Care – PPO | Admitting: Psychology

## 2012-09-11 ENCOUNTER — Ambulatory Visit (INDEPENDENT_AMBULATORY_CARE_PROVIDER_SITE_OTHER): Payer: BC Managed Care – PPO | Admitting: Psychology

## 2012-09-11 DIAGNOSIS — F4323 Adjustment disorder with mixed anxiety and depressed mood: Secondary | ICD-10-CM

## 2012-09-21 ENCOUNTER — Telehealth: Payer: Self-pay | Admitting: Internal Medicine

## 2012-09-21 NOTE — Telephone Encounter (Signed)
Pt called and stated that he would like to speak with Dr. Lovell Sheehan regarding his marriage. He stated that Dr. Lovell Sheehan has offered him counseling throughout his trials, and he wanted to speak with him because "it's all gone". He stated that he's severely depressed and would like to be seen. Please assist.

## 2012-09-21 NOTE — Telephone Encounter (Signed)
Appointment give for 5-28 at 8:30

## 2012-09-26 ENCOUNTER — Encounter: Payer: Self-pay | Admitting: Internal Medicine

## 2012-09-26 ENCOUNTER — Ambulatory Visit (INDEPENDENT_AMBULATORY_CARE_PROVIDER_SITE_OTHER): Payer: BC Managed Care – PPO | Admitting: Internal Medicine

## 2012-09-26 VITALS — BP 120/80 | HR 72 | Temp 98.2°F | Resp 16 | Ht 70.0 in | Wt 192.0 lb

## 2012-09-26 DIAGNOSIS — F4323 Adjustment disorder with mixed anxiety and depressed mood: Secondary | ICD-10-CM

## 2012-09-26 DIAGNOSIS — T887XXA Unspecified adverse effect of drug or medicament, initial encounter: Secondary | ICD-10-CM

## 2012-09-26 DIAGNOSIS — E785 Hyperlipidemia, unspecified: Secondary | ICD-10-CM

## 2012-09-26 LAB — LIPID PANEL: Cholesterol: 163 mg/dL (ref 0–200)

## 2012-09-26 LAB — HEPATIC FUNCTION PANEL
ALT: 72 U/L — ABNORMAL HIGH (ref 0–53)
Alkaline Phosphatase: 48 U/L (ref 39–117)
Bilirubin, Direct: 0.2 mg/dL (ref 0.0–0.3)
Total Protein: 7.1 g/dL (ref 6.0–8.3)

## 2012-09-26 NOTE — Progress Notes (Signed)
Subjective:    Patient ID: Kristopher Holden, male    DOB: 1955/10/27, 57 y.o.   MRN: [id number]  HPI Patient presents for followup of lipids.  Patient also presents for acute anxiety and stress over her relationship resulting in marked anxiety.     Review of Systems  Constitutional: Negative.   HENT: Negative.   Eyes: Negative.   Respiratory: Negative.   Cardiovascular: Negative.   Gastrointestinal: Negative.   Endocrine: Negative.   Genitourinary: Negative.   Neurological: Negative.   Psychiatric/Behavioral: Positive for agitation. The patient is nervous/anxious.    Past Medical History  Diagnosis Date  . Hyperlipidemia   . Osteoarthritis     History   Social History  . Marital Status: Married    Spouse Name: N/A    Number of Children: N/A  . Years of Education: N/A   Occupational History  . Not on file.   Social History Main Topics  . Smoking status: Never Smoker   . Smokeless tobacco: Not on file  . Alcohol Use: Yes  . Drug Use: No  . Sexually Active: Yes   Other Topics Concern  . Not on file   Social History Narrative  . No narrative on file    Past Surgical History  Procedure Laterality Date  . Total hip resurface       right  . Knee surgery  1991    partial acl tear reconstruction    Family History  Problem Relation Age of Onset  . Lung cancer Mother   . Hyperlipidemia Mother   . Diabetes Maternal Aunt   . Heart attack Father   . Hypertension Neg Hx     Allergies  Allergen Reactions  . Penicillins     REACTION: ? childhood    Current Outpatient Prescriptions on File Prior to Visit  Medication Sig Dispense Refill  . aspirin 81 MG tablet Take 81 mg by mouth daily.        Marland Kitchen atorvastatin (LIPITOR) 20 MG tablet TAKE 1 TABLET ONCE DAILY.  30 tablet  11  . diclofenac sodium (VOLTAREN) 1 % GEL Apply 4 g topically 4 (four) times daily.  3 Tube  1  . estazolam (PROSOM) 2 MG tablet Take 1 tablet (2 mg total) by mouth at bedtime as needed.  30  tablet  5  . fish oil-omega-3 fatty acids 1000 MG capsule Take 2 g by mouth 2 (two) times daily. Take 2 bid       . Multiple Vitamins-Minerals (MEGA MULTI MEN PO) Take 1 tablet by mouth daily.        Marland Kitchen PULMICORT FLEXHALER 180 MCG/ACT inhaler USE 1 PUFF TWICE DAILY.  1 each  6   No current facility-administered medications on file prior to visit.    BP 120/80  Pulse 72  Temp(Src) 98.2 F (36.8 C)  Resp 16  Ht 5\' 10"  (1.778 m)  Wt 192 lb (87.091 kg)  BMI 27.55 kg/m2       Objective:   Physical Exam  Nursing note and vitals reviewed. Constitutional: He appears well-developed and well-nourished.  HENT:  Head: Normocephalic and atraumatic.  Eyes: Conjunctivae are normal. Pupils are equal, round, and reactive to light.  Neck: Normal range of motion. Neck supple.  Cardiovascular: Normal rate and regular rhythm.   Pulmonary/Chest: Effort normal and breath sounds normal.  Abdominal: Soft. Bowel sounds are normal.  Neurological: He is alert.  Skin: Skin is warm and dry.  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  A lipid panel and a direct LDL will be monitored and a liver function will be monitored to assess statin success.  Patient was given 30 minutes of counseling about stress and stress management

## 2012-09-26 NOTE — Patient Instructions (Addendum)
The patient is instructed to continue all medications as prescribed. Schedule followup with check out clerk upon leaving the clinic  

## 2012-11-06 ENCOUNTER — Other Ambulatory Visit: Payer: Self-pay | Admitting: *Deleted

## 2012-11-06 MED ORDER — ESTAZOLAM 2 MG PO TABS: 2.0000 mg | ORAL_TABLET | Freq: Every evening | ORAL | Status: DC | PRN

## 2013-02-04 ENCOUNTER — Telehealth: Payer: Self-pay | Admitting: Internal Medicine

## 2013-02-04 NOTE — Telephone Encounter (Signed)
Pt states he needs lipid panel for his statin drug results in November. pls advise. If so, pt would like to go to [facility] for the labs.

## 2013-02-04 NOTE — Telephone Encounter (Signed)
He may have a lipid and a liver-272.4

## 2013-02-05 NOTE — Telephone Encounter (Signed)
lmom 

## 2013-02-07 ENCOUNTER — Other Ambulatory Visit: Payer: Self-pay | Admitting: *Deleted

## 2013-02-07 DIAGNOSIS — E785 Hyperlipidemia, unspecified: Secondary | ICD-10-CM

## 2013-02-07 NOTE — Telephone Encounter (Signed)
Pt would prefer to go to [facility] for those labs. Can you put the order in? Pt states he will go around Nov 10. Thanks!

## 2013-02-07 NOTE — Telephone Encounter (Signed)
done

## 2013-02-28 ENCOUNTER — Other Ambulatory Visit: Payer: Self-pay | Admitting: *Deleted

## 2013-02-28 MED ORDER — BECLOMETHASONE DIPROPIONATE 80 MCG/ACT IN AERS: 1.0000 | INHALATION_SPRAY | RESPIRATORY_TRACT | Status: DC | PRN

## 2013-03-01 ENCOUNTER — Other Ambulatory Visit: Payer: Self-pay | Admitting: *Deleted

## 2013-03-01 MED ORDER — BECLOMETHASONE DIPROPIONATE 80 MCG/ACT IN AERS: 1.0000 | INHALATION_SPRAY | Freq: Two times a day (BID) | RESPIRATORY_TRACT | Status: DC | PRN

## 2013-03-07 ENCOUNTER — Other Ambulatory Visit: Payer: Self-pay

## 2013-03-11 ENCOUNTER — Other Ambulatory Visit (INDEPENDENT_AMBULATORY_CARE_PROVIDER_SITE_OTHER): Payer: BC Managed Care – PPO

## 2013-03-11 DIAGNOSIS — E785 Hyperlipidemia, unspecified: Secondary | ICD-10-CM

## 2013-03-11 LAB — HEPATIC FUNCTION PANEL
ALT: 23 U/L (ref 0–53)
AST: 27 U/L (ref 0–37)
Albumin: 3.8 g/dL (ref 3.5–5.2)
Alkaline Phosphatase: 41 U/L (ref 39–117)
Bilirubin, Direct: 0.2 mg/dL (ref 0.0–0.3)
Total Bilirubin: 2.1 mg/dL — ABNORMAL HIGH (ref 0.3–1.2)
Total Protein: 7.1 g/dL (ref 6.0–8.3)

## 2013-03-11 LAB — LIPID PANEL
Cholesterol: 204 mg/dL — ABNORMAL HIGH (ref 0–200)
HDL: 67 mg/dL (ref >39.00)
Total CHOL/HDL Ratio: 3
VLDL: 23.8 mg/dL (ref 0.0–40.0)

## 2013-06-10 ENCOUNTER — Telehealth: Payer: Self-pay | Admitting: Internal Medicine

## 2013-06-10 NOTE — Telephone Encounter (Signed)
Patient Information:  Caller Name: Mallie Mussel  Phone: [phone]  Patient: Maison, Kestenbaum  Gender: Male  DOB: 1956/03/15  Age: 58 Years  PCP: Benay Pillow (Adults only)  Office Follow Up:  Does the office need to follow up with this patient?: No  Instructions For The Office: N/A  RN Note:  Care advice provided. Patient declined appt for evaluation at this time. Will call back for questions, changes or concerns.  Symptoms  Reason For Call & Symptoms: Patient reports onset of cough since Friday 06/07/13. Cough is productive-white. Glands are swollen at neck, no sore throat. No fever.  no runny nose.  Reviewed Health History In EMR: Yes  Reviewed Medications In EMR: Yes  Reviewed Allergies In EMR: Yes  Reviewed Surgeries / Procedures: Yes  Date of Onset of Symptoms: 06/07/2013  Treatments Tried: antihistamine  Treatments Tried Worked: No  Guideline(s) Used:  Cough  Disposition Per Guideline:   Home Care  Reason For Disposition Reached:   Cough with no complications  Advice Given:  Reassurance  Coughing is the way that our lungs remove irritants and mucus. It helps protect our lungs from getting pneumonia.  You can get a dry hacking cough after a chest cold. Sometimes this type of cough can last 1-3 weeks, and be worse at night.  Cough Medicines:  OTC Cough Syrups: The most common cough suppressant in OTC cough medications is dextromethorphan. Often the letters "DM" appear in the name.  OTC Cough Drops: Cough drops can help a lot, especially for mild coughs. They reduce coughing by soothing your irritated throat and removing that tickle sensation in the back of the throat. Cough drops also have the advantage of portability - you can carry them with you.  Home Remedy - Hard Candy: Hard candy works just as well as medicine-flavored OTC cough drops. Diabetics should use sugar-free candy.  Home Remedy - Honey: This old home remedy has been shown to help decrease coughing at night. The  adult dosage is 2 teaspoons (10 ml) at bedtime. Honey should not be given to infants under one year of age.  Prevent Dehydration:  Drink adequate liquids.  This will help soothe an irritated or dry throat and loosen up the phlegm.  Coughing Spasms:  Drink warm fluids. Inhale warm mist (Reason: both relax the airway and loosen up the phlegm).  Suck on cough drops or hard candy to coat the irritated throat.  Call Back If:  Difficulty breathing  Cough lasts more than 3 weeks  Fever lasts > 3 days  You become worse.  Patient Will Follow Care Advice:  YES

## 2013-06-13 ENCOUNTER — Encounter: Payer: Self-pay | Admitting: Internal Medicine

## 2013-06-13 ENCOUNTER — Other Ambulatory Visit: Payer: Self-pay | Admitting: *Deleted

## 2013-06-13 MED ORDER — AZITHROMYCIN 250 MG PO TABS: 250.0000 mg | ORAL_TABLET | Freq: Every day | ORAL | Status: DC

## 2013-07-07 ENCOUNTER — Encounter: Payer: Self-pay | Admitting: Internal Medicine

## 2013-07-08 ENCOUNTER — Encounter: Payer: Self-pay | Admitting: Family Medicine

## 2013-07-08 ENCOUNTER — Ambulatory Visit (INDEPENDENT_AMBULATORY_CARE_PROVIDER_SITE_OTHER)
Admission: RE | Admit: 2013-07-08 | Discharge: 2013-07-08 | Disposition: A | Discharge status: Home / Self Care | Payer: BC Managed Care – PPO | Source: Ambulatory Visit | Attending: Family Medicine | Admitting: Family Medicine

## 2013-07-08 ENCOUNTER — Ambulatory Visit (INDEPENDENT_AMBULATORY_CARE_PROVIDER_SITE_OTHER): Payer: BC Managed Care – PPO | Admitting: Family Medicine

## 2013-07-08 VITALS — BP 110/60 | HR 74 | Temp 98.2°F | Ht 70.0 in | Wt 199.0 lb

## 2013-07-08 DIAGNOSIS — R05 Cough: Secondary | ICD-10-CM

## 2013-07-08 DIAGNOSIS — R059 Cough, unspecified: Secondary | ICD-10-CM

## 2013-07-08 MED ORDER — BUDESONIDE 180 MCG/ACT IN AEPB: 2.0000 | INHALATION_SPRAY | Freq: Two times a day (BID) | RESPIRATORY_TRACT | Status: DC

## 2013-07-08 NOTE — Progress Notes (Signed)
   Subjective:    Patient ID: Kristopher Holden, male    DOB: 07/01/1955, 57 y.o.   MRN: [id number]  HPI Here for 6 weeks of a dry hacking cough. No SOB or chest pain, no wheezing. He has a dx of asthma and he typically has trouble with this about this time every year. He has used Pulmicort Flexhaler with succes in the past, but last year he switched to Qvar due to insurance coverage reasons. This does not seem to work as well for him. He took a Zpack about 5 weeks ago with no benefit. He denies any GERD sx. He was exposed to a lot of second hand smoke as a child.    Review of Systems  Constitutional: Negative.   HENT: Negative.   Eyes: Negative.   Respiratory: Positive for cough. Negative for apnea, choking, chest tightness, shortness of breath, wheezing and stridor.   Cardiovascular: Negative.        Objective:   Physical Exam  Constitutional: He appears well-developed and well-nourished. No distress.  HENT:  Right Ear: External ear normal.  Left Ear: External ear normal.  Nose: Nose normal.  Mouth/Throat: Oropharynx is clear and moist.  Eyes: Conjunctivae are normal.  Cardiovascular: Normal rate, regular rhythm, normal heart sounds and intact distal pulses.   Pulmonary/Chest: Effort normal and breath sounds normal. No respiratory distress. He has no wheezes. He has no rales.  Lymphadenopathy:    He has no cervical adenopathy.          Assessment & Plan:  Probable asthma variant cough. He has not had a CXR in many years so we will send him for one today. Switch back to Pulmicort Flexhaler bid.

## 2013-07-08 NOTE — Progress Notes (Signed)
Pre visit review using our clinic review tool, if applicable. No additional management support is needed unless otherwise documented below in the visit note. 

## 2013-07-09 ENCOUNTER — Telehealth: Payer: Self-pay | Admitting: Internal Medicine

## 2013-07-09 DIAGNOSIS — Z0000 Encounter for general adult medical examination without abnormal findings: Secondary | ICD-10-CM

## 2013-07-09 NOTE — Telephone Encounter (Addendum)
Pt would like dr Arnoldo Morale to call him concerning a cough. Pt saw dr fry yesterday. Pt has cpx sch for July 2015. Pt would like to have labs drawn at [facility]. Please put order in system

## 2013-07-11 NOTE — Telephone Encounter (Signed)
Pt gets a cough every jan, feb, and march each year.  Dr Sarajane Jews said he could have asthma and was given an pulmicort inhaler.  Productive cough.  He states he has never been SOB.  No wheezing.  He is confused about the asthma dx and if there is anything else he needs to be doing.  Please advise

## 2013-07-11 NOTE — Telephone Encounter (Signed)
Future orders placed for [facility], Left message for pt to call back

## 2013-07-12 ENCOUNTER — Encounter: Payer: Self-pay | Admitting: Internal Medicine

## 2013-07-12 MED ORDER — AZITHROMYCIN 250 MG PO TABS: ORAL_TABLET | ORAL | Status: DC

## 2013-07-12 NOTE — Telephone Encounter (Signed)
Reactive asthma just mean that there is bronchospasm If the sputum changes colors or it persists he may need an antibiotic

## 2013-07-12 NOTE — Telephone Encounter (Signed)
Pt states that he is running fever now 100.1.  Per Dr Arnoldo Morale ok to send in zpack.  Rx sent in electronically to [facility]

## 2013-07-14 ENCOUNTER — Encounter: Payer: Self-pay | Admitting: Internal Medicine

## 2013-07-16 ENCOUNTER — Encounter: Payer: Self-pay | Admitting: Internal Medicine

## 2013-07-18 ENCOUNTER — Encounter: Payer: Self-pay | Admitting: Internal Medicine

## 2013-07-28 ENCOUNTER — Other Ambulatory Visit: Payer: Self-pay | Admitting: Internal Medicine

## 2013-09-08 ENCOUNTER — Other Ambulatory Visit: Payer: Self-pay | Admitting: Internal Medicine

## 2013-10-04 ENCOUNTER — Ambulatory Visit: Payer: BC Managed Care – PPO | Admitting: Internal Medicine

## 2013-11-15 ENCOUNTER — Encounter: Payer: Self-pay | Admitting: Internal Medicine

## 2013-11-22 ENCOUNTER — Encounter: Payer: BC Managed Care – PPO | Admitting: Internal Medicine

## 2014-03-07 ENCOUNTER — Encounter: Payer: Self-pay | Admitting: Internal Medicine

## 2014-03-31 ENCOUNTER — Telehealth: Payer: Self-pay

## 2014-03-31 NOTE — Telephone Encounter (Signed)
Rx request from [facility] for estazolam 2 mg-take 1 tablet at bedtime as need for sleep.  Pls advise.

## 2014-04-01 MED ORDER — ESTAZOLAM 2 MG PO TABS: ORAL_TABLET | ORAL | Status: DC

## 2014-04-01 NOTE — Telephone Encounter (Signed)
Ok per Dr Arnoldo Morale for a 30 day supply but pt will need OV.  30 days supply called in

## 2015-01-28 ENCOUNTER — Encounter: Payer: Self-pay | Admitting: Internal Medicine

## 2015-11-24 ENCOUNTER — Encounter: Payer: Self-pay | Admitting: Internal Medicine

## 2016-01-26 ENCOUNTER — Ambulatory Visit (AMBULATORY_SURGERY_CENTER): Payer: Self-pay

## 2016-01-26 VITALS — Ht 70.0 in | Wt 200.8 lb

## 2016-01-26 DIAGNOSIS — Z1211 Encounter for screening for malignant neoplasm of colon: Secondary | ICD-10-CM

## 2016-01-26 MED ORDER — NA SULFATE-K SULFATE-MG SULF 17.5-3.13-1.6 GM/177ML PO SOLN: ORAL | 0 refills | Status: DC

## 2016-01-26 NOTE — Progress Notes (Signed)
Per pt, no allergies to soy or egg products.Pt not taking any weight loss meds or using  O2 at home. 

## 2016-02-02 ENCOUNTER — Encounter: Payer: Self-pay | Admitting: Internal Medicine

## 2016-02-02 ENCOUNTER — Ambulatory Visit (AMBULATORY_SURGERY_CENTER): Payer: Managed Care, Other (non HMO) | Admitting: Internal Medicine

## 2016-02-02 VITALS — BP 92/63 | HR 49 | Temp 97.1°F | Resp 12 | Ht 70.0 in | Wt 199.0 lb

## 2016-02-02 DIAGNOSIS — D125 Benign neoplasm of sigmoid colon: Secondary | ICD-10-CM

## 2016-02-02 DIAGNOSIS — Z1212 Encounter for screening for malignant neoplasm of rectum: Secondary | ICD-10-CM

## 2016-02-02 DIAGNOSIS — K635 Polyp of colon: Secondary | ICD-10-CM | POA: Diagnosis not present

## 2016-02-02 DIAGNOSIS — Z1211 Encounter for screening for malignant neoplasm of colon: Secondary | ICD-10-CM | POA: Diagnosis not present

## 2016-02-02 MED ORDER — SODIUM CHLORIDE 0.9 % IV SOLN: 500.0000 mL | INTRAVENOUS | Status: DC

## 2016-02-02 NOTE — Progress Notes (Signed)
To recovery, report to [place], RN, VSS 

## 2016-02-02 NOTE — Progress Notes (Signed)
Called to room to assist during endoscopic procedure.  Patient ID and intended procedure confirmed with present staff. Received instructions for my participation in the procedure from the performing physician.  

## 2016-02-02 NOTE — Patient Instructions (Signed)
YOU HAD AN ENDOSCOPIC PROCEDURE TODAY AT [facility]:   Refer to the procedure report that was given to you for any specific questions about what was found during the examination.  If the procedure report does not answer your questions, please call your gastroenterologist to clarify.  If you requested that your care partner not be given the details of your procedure findings, then the procedure report has been included in a sealed envelope for you to review at your convenience later.  YOU SHOULD EXPECT: Some feelings of bloating in the abdomen. Passage of more gas than usual.  Walking can help get rid of the air that was put into your GI tract during the procedure and reduce the bloating. If you had a lower endoscopy (such as a colonoscopy or flexible sigmoidoscopy) you may notice spotting of blood in your stool or on the toilet paper. If you underwent a bowel prep for your procedure, you may not have a normal bowel movement for a few days.  Please Note:  You might notice some irritation and congestion in your nose or some drainage.  This is from the oxygen used during your procedure.  There is no need for concern and it should clear up in a day or so.  SYMPTOMS TO REPORT IMMEDIATELY:   Following lower endoscopy (colonoscopy or flexible sigmoidoscopy):  Excessive amounts of blood in the stool  Significant tenderness or worsening of abdominal pains  Swelling of the abdomen that is new, acute  Fever of 100F or higher  For urgent or emergent issues, a gastroenterologist can be reached at any hour by calling [phone].   DIET:  We do recommend a small meal at first, but then you may proceed to your regular diet.  Drink plenty of fluids but you should avoid alcoholic beverages for 24 hours.  ACTIVITY:  You should plan to take it easy for the rest of today and you should NOT DRIVE or use heavy machinery until tomorrow (because of the sedation medicines used during the test).     FOLLOW UP: Our staff will call the number listed on your records the next business day following your procedure to check on you and address any questions or concerns that you may have regarding the information given to you following your procedure. If we do not reach you, we will leave a message.  However, if you are feeling well and you are not experiencing any problems, there is no need to return our call.  We will assume that you have returned to your regular daily activities without incident.  If any biopsies were taken you will be contacted by phone or by letter within the next 1-3 weeks.  Please call us at [phone] if you have not heard about the biopsies in 3 weeks.    SIGNATURES/CONFIDENTIALITY: You and/or your care partner have signed paperwork which will be entered into your electronic medical record.  These signatures attest to the fact that that the information above on your After Visit Summary has been reviewed and is understood.  Full responsibility of the confidentiality of this discharge information lies with you and/or your care-partner.  Await pathology  Continue your normal medications  Please read over handouts about polyps, diverticulosis, hemorrhoids, and high fiber diets

## 2016-02-02 NOTE — Op Note (Addendum)
[facility] Patient Name: Kristopher Holden Procedure Date: 02/02/2016 8:30 AM MRN: [id number] Endoscopist: Docia Chuck. Henrene Pastor , MD Age: 60 Referring MD:  Date of Birth: [birth year] Gender: Male Account #: [id number] Procedure:                Colonoscopy, with cold snare polypectomy x 1 Indications:              Screening for colorectal malignant neoplasm. Index                            exam 10/15/2004 was negative for neoplasia Medicines:                Monitored Anesthesia Care Procedure:                Pre-Anesthesia Assessment:                           - Prior to the procedure, a History and Physical                            was performed, and patient medications and                            allergies were reviewed. The patient's tolerance of                            previous anesthesia was also reviewed. The risks                            and benefits of the procedure and the sedation                            options and risks were discussed with the patient.                            All questions were answered, and informed consent                            was obtained. Prior Anticoagulants: The patient has                            taken no previous anticoagulant or antiplatelet                            agents. ASA Grade Assessment: II - A patient with                            mild systemic disease. After reviewing the risks                            and benefits, the patient was deemed in                            satisfactory condition to undergo the procedure.  After obtaining informed consent, the colonoscope                            was passed under direct vision. Throughout the                            procedure, the patient's blood pressure, pulse, and                            oxygen saturations were monitored continuously. The                            Model CF-HQ190L [id number]) scope was introduced                through the anus and advanced to the the cecum,                            identified by appendiceal orifice and ileocecal                            valve. The ileocecal valve, appendiceal orifice,                            and rectum were photographed. The quality of the                            bowel preparation was excellent. The colonoscopy                            was performed without difficulty. The patient                            tolerated the procedure well. The bowel preparation                            used was SUPREP. Scope In: 8:40:48 AM Scope Out: [id number] AM Scope Withdrawal Time: 0 hours 13 minutes 22 seconds  Total Procedure Duration: 0 hours 15 minutes 56 seconds  Findings:                 A 3 mm polyp was found in the sigmoid colon. The                            polyp was removed with a cold snare. Resection and                            retrieval were complete.                           A few small-mouthed diverticula were found in the                            left colon.  Internal hemorrhoids were found during retroflexion.                           The exam was otherwise without abnormality on                            direct and retroflexion views. Complications:            No immediate complications. Estimated blood loss:                            None. Estimated Blood Loss:     Estimated blood loss: none. Impression:               - One 3 mm polyp in the sigmoid colon, removed with                            a cold snare. Resected and retrieved.                           - Diverticulosis in the left colon.                           - Internal hemorrhoids.                           - The examination was otherwise normal on direct                            and retroflexion views. Recommendation:           - Repeat colonoscopy in 5-10 years for                            surveillance, based on final pathology  results.                           - Patient has a contact number available for                            emergencies. The signs and symptoms of potential                            delayed complications were discussed with the                            patient. Return to normal activities tomorrow.                            Written discharge instructions were provided to the                            patient.                           - Resume previous diet.                           -  Continue present medications.                           - Await pathology results. Docia Chuck. Henrene Pastor, MD 02/02/2016 9:02:52 AM This report has been signed electronically.

## 2016-02-03 ENCOUNTER — Telehealth: Payer: Self-pay

## 2016-02-03 NOTE — Telephone Encounter (Signed)
  Follow up Call-  Call back number 02/02/2016  Post procedure Call Back phone  # [phone]  Permission to leave phone message Yes  Some recent data might be hidden     Patient questions:  Do you have a fever, pain , or abdominal swelling? No. Pain Score  0 *  Have you tolerated food without any problems? Yes.    Have you been able to return to your normal activities? Yes.    Do you have any questions about your discharge instructions: Diet   Yes.   Medications  No. Follow up visit  No.  Do you have questions or concerns about your Care? No.  Actions: * If pain score is 4 or above: No action needed, pain <4.

## 2016-02-05 ENCOUNTER — Encounter: Payer: Self-pay | Admitting: Internal Medicine

## 2016-04-01 ENCOUNTER — Other Ambulatory Visit: Payer: Self-pay | Admitting: *Deleted

## 2016-04-01 DIAGNOSIS — R200 Anesthesia of skin: Secondary | ICD-10-CM

## 2016-04-26 ENCOUNTER — Ambulatory Visit (INDEPENDENT_AMBULATORY_CARE_PROVIDER_SITE_OTHER): Payer: Managed Care, Other (non HMO) | Admitting: Neurology

## 2016-04-26 DIAGNOSIS — R200 Anesthesia of skin: Secondary | ICD-10-CM | POA: Diagnosis not present

## 2016-04-26 DIAGNOSIS — M5417 Radiculopathy, lumbosacral region: Secondary | ICD-10-CM

## 2016-04-26 NOTE — Procedures (Signed)
[facility]  [place], [place]  [place], [place] [address] Tel: [phone] Fax:  [phone] Test Date:  04/26/2016  Patient: Kristopher Holden DOB: 1956/01/03 Physician: Narda Amber, DO  Sex: Male Height: 5\' 10"  Ref Phys: Burnard Bunting, MD  ID#: [id number] Temp: 32.1C Technician:    Patient Complaints: This is a 60 year-old gentleman referred for evaluation of right toe flexion weakness and paresthesias of the foot.   NCV & EMG Findings: Extensive electrodiagnostic testing of the right lower extremity and additional studies of the left shows:  1. Right sural and superficial peroneal sensory responses are within normal limits. 2. Right peroneal and tibial motor responses are within normal limits. 3. Bilateral tibial H reflexes studies show mildly prolonged latency. 4. Chronic motor axon loss changes are seen affecting the S1 myotomes on the right side only. There is no evidence of accompanied active denervation.  Impression: 1. Chronic S1 radiculopathy affecting the right lower extremity; mild in degree electrically. 2. There is no evidence of a sensorimotor polyneuropathy affecting the right lower extremity.   ___________________________ Narda Amber, DO    Nerve Conduction Studies Anti Sensory Summary Table   Site NR Peak (ms) Norm Peak (ms) P-T Amp (V) Norm P-T Amp  Right Sup Peroneal Anti Sensory (Ant Lat Mall)  12 cm    3.3 <4.6 9.9 >3  Right Sural Anti Sensory (Lat Mall)  Calf    3.6 <4.6 9.9 >3   Motor Summary Table   Site NR Onset (ms) Norm Onset (ms) O-P Amp (mV) Norm O-P Amp Site1 Site2 Delta-0 (ms) Dist (cm) Vel (m/s) Norm Vel (m/s)  Right Peroneal Motor (Ext Dig Brev)  Ankle    3.4 <6.0 5.6 >2.5 B Fib Ankle 6.5 36.0 55 >40  B Fib    9.9  5.3  Poplt B Fib 1.5 8.0 53 >40  Poplt    11.4  5.1         Right Tibial Motor (Abd Hall Brev)  Ankle    4.7 <6.0 14.0 >4 Knee Ankle 9.1 39.0 43 >40  Knee    13.8  9.7          H Reflex Studies   NR H-Lat (ms) Lat Norm (ms) L-R H-Lat (ms)  Left Tibial (Gastroc)     37.14 <35 1.77  Right Tibial (Gastroc)     38.91 <35 1.77   EMG   Side Muscle Ins Act Fibs Psw Fasc Number Recrt Dur Dur. Amp Amp. Poly Poly. Comment  Right AntTibialis Nml Nml Nml Nml Nml Nml Nml Nml Nml Nml Nml Nml N/A  Right Gastroc Nml Nml Nml Nml 1- Rapid Some 1+ Some 1+ Nml Nml N/A  Right Flex Dig Long Nml Nml Nml Nml 1- Mod-R Some 1+ Some 1+ Nml Nml N/A  Right RectFemoris Nml Nml Nml Nml Nml Nml Nml Nml Nml Nml Nml Nml N/A  Right GluteusMed Nml Nml Nml Nml Nml Nml Nml Nml Nml Nml Nml Nml N/A  Right Lumbo Parasp Low Nml Nml Nml Nml Nml Nml Nml Nml Nml Nml Nml Nml N/A  Right BicepsFemS Nml Nml Nml Nml 1- Rapid Some 1+ Some 1+ Nml Nml N/A  Left Gastroc Nml Nml Nml Nml Nml Nml Nml Nml Nml Nml Nml Nml N/A      Waveforms:

## 2016-05-17 ENCOUNTER — Ambulatory Visit (INDEPENDENT_AMBULATORY_CARE_PROVIDER_SITE_OTHER): Payer: Managed Care, Other (non HMO) | Admitting: Neurology

## 2016-05-17 ENCOUNTER — Encounter: Payer: Self-pay | Admitting: Neurology

## 2016-05-17 VITALS — BP 110/70 | HR 72 | Ht 70.0 in | Wt 205.5 lb

## 2016-05-17 DIAGNOSIS — M5417 Radiculopathy, lumbosacral region: Secondary | ICD-10-CM

## 2016-05-17 NOTE — Progress Notes (Signed)
[facility] Clinic Note - Initial Visit   Date: 05/17/16  Kristopher Holden MRN: [id number] DOB: May 09, 1955   Dear Dr. Reynaldo Minium:  Thank you for your kind referral of Kristopher Holden for consultation of right foot numbness. Although his history is well known to you, please allow [place] to reiterate it for the purpose of our medical record. The patient was accompanied to the clinic by self.    History of Present Illness: Kristopher Holden is a 61 y.o. right-handed Caucasian male with hyperlipidemia and OA presenting for evaluation of right foot numbness.    Starting in June 2017, he was doing 100km bike ride and during the last 5 miles, he started noticing numbness over the right lateral foot. Since this time, the intensity of numbness has improved, but he has a constant numbness that is present. Symptoms are aggravated with biking.  He denies any tingling or pain.  He denies any weakness, falls, or imbalance.  He does not have any low back pain, but used to have a lot of lumbar strain previously which he has managed himself with home exercises.  He had NCS/EMG of the right leg performed in December 2017 by myself which was most consistent with right S1 radiculopathy.   He also complains of chronic right knee pain and is contemplating having partial knee replacement surgery in the coming months.   Out-side paper records, electronic medical record, and images have been reviewed where available and summarized as:  NCS/EMG of the right leg 04/26/2016: 1. Chronic S1 radiculopathy affecting the right lower extremity; mild in degree electrically. 2. There is no evidence of a sensorimotor polyneuropathy affecting the right lower extremity.  Past Medical History:  Diagnosis Date  . Complication of anesthesia    per pt, sensitive to sedation/ B/P drops!  . Hyperlipidemia   . Osteoarthritis     Past Surgical History:  Procedure Laterality Date  . COLONOSCOPY    . KNEE SURGERY   1991   partial acl tear reconstruction/ left knee  . total hip resurface   2009   right     Medications:  Outpatient Encounter Prescriptions as of 05/17/2016  Medication Sig Note  . aspirin 81 MG tablet Take 81 mg by mouth daily.     Marland Kitchen atorvastatin (LIPITOR) 20 MG tablet TAKE 1 TABLET ONCE DAILY.   . fish oil-omega-3 fatty acids 1000 MG capsule Take 2 g by mouth 2 (two) times daily. Take 2 bid    . meloxicam (MOBIC) 15 MG tablet Take by mouth. 05/17/2016: Received from: [place]: Take 1 tablet (15 mg total) by mouth once daily. Take with food  . Multiple Vitamins-Minerals (MEGA MULTI MEN PO) Take 1 tablet by mouth daily.     . sildenafil (VIAGRA) 100 MG tablet Take 100 mg by mouth. Take 1/4 pills occasionally   . TURMERIC PO Take by mouth daily.    Facility-Administered Encounter Medications as of 05/17/2016  Medication  . 0.9 %  sodium chloride infusion     Allergies:  Allergies  Allergen Reactions  . Penicillins     REACTION: ? childhood    Family History: Family History  Problem Relation Age of Onset  . Lung cancer Mother   . Hyperlipidemia Mother   . Diabetes Maternal Aunt   . Heart attack Father   . Healthy Brother   . Healthy Son   . Hypertension Neg Hx     Social History: Social History  Substance Use  Topics  . Smoking status: Never Smoker  . Smokeless tobacco: Never Used  . Alcohol use 3.0 - 3.6 oz/week    5 - 6 Standard drinks or equivalent per week     Comment: 0-2 nightly   Social History   Social History Narrative   Lives alone in a condo with an Media planner.  Has 2 children.  Works as an Forensic psychologist.  Education: college.     Review of Systems:  CONSTITUTIONAL: No fevers, chills, night sweats, or weight loss.   EYES: No visual changes or eye pain ENT: No hearing changes.  No history of nose bleeds.   RESPIRATORY: No cough, wheezing and shortness of breath.   CARDIOVASCULAR: Negative for chest pain, and palpitations.     GI: Negative for abdominal discomfort, blood in stools or black stools.  No recent change in bowel habits.   GU:  No history of incontinence.   MUSCLOSKELETAL: No history of joint pain or swelling.  No myalgias.   SKIN: Negative for lesions, rash, and itching.   HEMATOLOGY/ONCOLOGY: Negative for prolonged bleeding, bruising easily, and swollen nodes.  No history of cancer.   ENDOCRINE: Negative for cold or heat intolerance, polydipsia or goiter.   PSYCH:  No depression or anxiety symptoms.   NEURO: As Above.   Vital Signs:  BP 110/70   Pulse 72   Ht 5\' 10"  (1.778 m)   Wt 205 lb 8 oz (93.2 kg)   SpO2 98%   BMI 29.49 kg/m   Neurological Exam: MENTAL STATUS including orientation to time, place, person, recent and remote memory, attention span and concentration, language, and fund of knowledge is normal.  Speech is not dysarthric.  CRANIAL NERVES: II:  No visual field defects.     III-IV-VI: Pupils equal round and reactive to light.  Normal conjugate, extra-ocular eye movements in all directions of gaze.  No nystagmus.  No ptosis.   V:  Normal facial sensation.  VII:  Normal facial symmetry and movements.   VIII:  Normal hearing and vestibular function.   IX-X:  Normal palatal movement.   XI:  Normal shoulder shrug and head rotation.   XII:  Normal tongue strength and range of motion, no deviation or fasciculation.  MOTOR:  No atrophy, fasciculations or abnormal movements.  No pronator drift.  Tone is normal.    Right Upper Extremity:    Left Upper Extremity:    Deltoid  5/5   Deltoid  5/5   Biceps  5/5   Biceps  5/5   Triceps  5/5   Triceps  5/5   Wrist extensors  5/5   Wrist extensors  5/5   Wrist flexors  5/5   Wrist flexors  5/5   Finger extensors  5/5   Finger extensors  5/5   Finger flexors  5/5   Finger flexors  5/5   Dorsal interossei  5/5   Dorsal interossei  5/5   Abductor pollicis  5/5   Abductor pollicis  5/5   Tone (Ashworth scale)  0  Tone (Ashworth scale)  0    Right Lower Extremity:    Left Lower Extremity:    Hip flexors  5/5   Hip flexors  5/5   Hip extensors  5/5   Hip extensors  5/5   Knee flexors  5/5   Knee flexors  5/5   Knee extensors  5/5   Knee extensors  5/5   Dorsiflexors  5/5   Dorsiflexors  5/5  Inversion 5/5  Inversion 5/5  Eversion 5/5  Eversion 5/5  Plantarflexors  5/5   Plantarflexors  5/5   Toe extensors  5/5   Toe extensors  5/5   Toe flexors  5-/5   Toe flexors  5/5   Tone (Ashworth scale)  0  Tone (Ashworth scale)  0   MSRs:  Right                                                                 Left brachioradialis 2+  brachioradialis 2+  biceps 2+  biceps 2+  triceps 2+  triceps 2+  patellar 2+  patellar 2+  ankle jerk 2++  ankle jerk 2+  Hoffman no  Hoffman no  plantar response down  plantar response down   SENSORY:  Pin prick is mildly reduced over the right lateral aspect of the foot, especially last two toes.  Vibration, temperature, and light touch intact. Romberg's sign absent.   COORDINATION/GAIT: Normal finger-to- nose-finger and heel-to-shin.  Intact rapid alternating movements bilaterally. Gait narrow based and stable. Stressed gait intact. There is mild unsteadiness with tandem gait, but with repeated effort, he is able to perform.    IMPRESSION: Mr. Savoca is a 61 year-old gentleman referred for evaluation of right S1 radiculopathy manifesting with right foot numbness.  His exam and electrodiagnostic testing is most consistent with a S1 radiculopathy.  He denies low back pain, radicular pain, or tingling paresthesias.  Symptoms are exclusively numbness and mild weakness with toe flexion.  I recommend starting low back strengthening exercises and physical therapy for traction. He already sees a Physiological scientist and will inquire whether his trainer can work with him on this.  If not, he will see a physical therapist.  I asked him to watch for signs of worsening toe and foot weakness, which will warrant  lumbar imaging.    Return to clinic as needed  The duration of this appointment visit was 35 minutes of face-to-face time with the patient.  Greater than 50% of this time was spent in counseling, explanation of diagnosis, planning of further management, and coordination of care.   Thank you for allowing me to participate in patient's care.  If I can answer any additional questions, I would be pleased to do so.    Sincerely,    Donika K. Posey Pronto, DO

## 2016-05-17 NOTE — Patient Instructions (Signed)
1.  Start low back exercises and traction with your trainer. 2.  Return to clinic as needed

## 2017-02-21 DIAGNOSIS — Z471 Aftercare following joint replacement surgery: Secondary | ICD-10-CM | POA: Diagnosis not present

## 2017-02-21 DIAGNOSIS — E785 Hyperlipidemia, unspecified: Secondary | ICD-10-CM | POA: Diagnosis not present

## 2017-02-21 DIAGNOSIS — Z880 Allergy status to penicillin: Secondary | ICD-10-CM | POA: Diagnosis not present

## 2017-02-21 DIAGNOSIS — Z96651 Presence of right artificial knee joint: Secondary | ICD-10-CM | POA: Diagnosis not present

## 2017-02-21 DIAGNOSIS — Z9889 Other specified postprocedural states: Secondary | ICD-10-CM | POA: Diagnosis not present

## 2017-02-21 DIAGNOSIS — Z79899 Other long term (current) drug therapy: Secondary | ICD-10-CM | POA: Diagnosis not present

## 2017-03-07 DIAGNOSIS — H25043 Posterior subcapsular polar age-related cataract, bilateral: Secondary | ICD-10-CM | POA: Diagnosis not present

## 2017-03-07 DIAGNOSIS — H33301 Unspecified retinal break, right eye: Secondary | ICD-10-CM | POA: Diagnosis not present

## 2017-03-07 DIAGNOSIS — H5213 Myopia, bilateral: Secondary | ICD-10-CM | POA: Diagnosis not present

## 2017-03-14 DIAGNOSIS — H33301 Unspecified retinal break, right eye: Secondary | ICD-10-CM | POA: Diagnosis not present

## 2017-03-14 DIAGNOSIS — H43813 Vitreous degeneration, bilateral: Secondary | ICD-10-CM | POA: Diagnosis not present

## 2017-05-10 DIAGNOSIS — H33301 Unspecified retinal break, right eye: Secondary | ICD-10-CM | POA: Diagnosis not present

## 2017-05-10 DIAGNOSIS — H43813 Vitreous degeneration, bilateral: Secondary | ICD-10-CM | POA: Diagnosis not present

## 2017-05-23 DIAGNOSIS — M25561 Pain in right knee: Secondary | ICD-10-CM | POA: Diagnosis not present

## 2017-05-23 DIAGNOSIS — R531 Weakness: Secondary | ICD-10-CM | POA: Diagnosis not present

## 2017-05-26 DIAGNOSIS — M1711 Unilateral primary osteoarthritis, right knee: Secondary | ICD-10-CM | POA: Diagnosis not present

## 2017-06-14 DIAGNOSIS — H5000 Unspecified esotropia: Secondary | ICD-10-CM | POA: Diagnosis not present

## 2017-06-14 DIAGNOSIS — H25013 Cortical age-related cataract, bilateral: Secondary | ICD-10-CM | POA: Diagnosis not present

## 2017-06-19 DIAGNOSIS — R05 Cough: Secondary | ICD-10-CM | POA: Diagnosis not present

## 2017-06-19 DIAGNOSIS — E7849 Other hyperlipidemia: Secondary | ICD-10-CM | POA: Diagnosis not present

## 2017-06-19 DIAGNOSIS — E663 Overweight: Secondary | ICD-10-CM | POA: Diagnosis not present

## 2017-06-19 DIAGNOSIS — R200 Anesthesia of skin: Secondary | ICD-10-CM | POA: Diagnosis not present

## 2017-07-10 DIAGNOSIS — M25561 Pain in right knee: Secondary | ICD-10-CM | POA: Diagnosis not present

## 2017-07-10 DIAGNOSIS — R531 Weakness: Secondary | ICD-10-CM | POA: Diagnosis not present

## 2017-08-24 DIAGNOSIS — H25012 Cortical age-related cataract, left eye: Secondary | ICD-10-CM | POA: Diagnosis not present

## 2017-08-24 DIAGNOSIS — H268 Other specified cataract: Secondary | ICD-10-CM | POA: Diagnosis not present

## 2017-08-24 DIAGNOSIS — H25812 Combined forms of age-related cataract, left eye: Secondary | ICD-10-CM | POA: Diagnosis not present

## 2017-08-31 DIAGNOSIS — H25811 Combined forms of age-related cataract, right eye: Secondary | ICD-10-CM | POA: Diagnosis not present

## 2017-08-31 DIAGNOSIS — H268 Other specified cataract: Secondary | ICD-10-CM | POA: Diagnosis not present

## 2017-08-31 DIAGNOSIS — H2511 Age-related nuclear cataract, right eye: Secondary | ICD-10-CM | POA: Diagnosis not present

## 2017-09-11 DIAGNOSIS — H5005 Alternating esotropia: Secondary | ICD-10-CM | POA: Diagnosis not present

## 2017-09-28 DIAGNOSIS — H33311 Horseshoe tear of retina without detachment, right eye: Secondary | ICD-10-CM | POA: Diagnosis not present

## 2017-09-28 DIAGNOSIS — H43813 Vitreous degeneration, bilateral: Secondary | ICD-10-CM | POA: Diagnosis not present

## 2017-09-28 DIAGNOSIS — H43393 Other vitreous opacities, bilateral: Secondary | ICD-10-CM | POA: Diagnosis not present

## 2017-10-02 ENCOUNTER — Other Ambulatory Visit: Payer: Self-pay

## 2017-10-02 ENCOUNTER — Encounter (HOSPITAL_BASED_OUTPATIENT_CLINIC_OR_DEPARTMENT_OTHER): Payer: Self-pay | Admitting: *Deleted

## 2017-10-03 ENCOUNTER — Ambulatory Visit: Payer: Self-pay | Admitting: Ophthalmology

## 2017-10-04 DIAGNOSIS — E7849 Other hyperlipidemia: Secondary | ICD-10-CM | POA: Diagnosis not present

## 2017-10-04 DIAGNOSIS — Z0000 Encounter for general adult medical examination without abnormal findings: Secondary | ICD-10-CM | POA: Diagnosis not present

## 2017-10-04 DIAGNOSIS — Z125 Encounter for screening for malignant neoplasm of prostate: Secondary | ICD-10-CM | POA: Diagnosis not present

## 2017-10-06 ENCOUNTER — Encounter (HOSPITAL_BASED_OUTPATIENT_CLINIC_OR_DEPARTMENT_OTHER)
Admission: RE | Disposition: A | Discharge status: Home / Self Care | Payer: Self-pay | Source: Ambulatory Visit | Attending: Ophthalmology

## 2017-10-06 ENCOUNTER — Ambulatory Visit (HOSPITAL_BASED_OUTPATIENT_CLINIC_OR_DEPARTMENT_OTHER)
Admission: RE | Admit: 2017-10-06 | Discharge: 2017-10-06 | Disposition: A | Discharge status: Home / Self Care | Payer: BLUE CROSS/BLUE SHIELD | Source: Ambulatory Visit | Attending: Ophthalmology | Admitting: Ophthalmology

## 2017-10-06 ENCOUNTER — Ambulatory Visit (HOSPITAL_BASED_OUTPATIENT_CLINIC_OR_DEPARTMENT_OTHER): Payer: BLUE CROSS/BLUE SHIELD | Admitting: Anesthesiology

## 2017-10-06 ENCOUNTER — Ambulatory Visit: Payer: Self-pay | Admitting: Ophthalmology

## 2017-10-06 ENCOUNTER — Encounter (HOSPITAL_BASED_OUTPATIENT_CLINIC_OR_DEPARTMENT_OTHER): Payer: Self-pay | Admitting: *Deleted

## 2017-10-06 ENCOUNTER — Other Ambulatory Visit: Payer: Self-pay

## 2017-10-06 DIAGNOSIS — H5000 Unspecified esotropia: Secondary | ICD-10-CM | POA: Insufficient documentation

## 2017-10-06 DIAGNOSIS — Z79899 Other long term (current) drug therapy: Secondary | ICD-10-CM | POA: Diagnosis not present

## 2017-10-06 DIAGNOSIS — E785 Hyperlipidemia, unspecified: Secondary | ICD-10-CM | POA: Insufficient documentation

## 2017-10-06 DIAGNOSIS — H5005 Alternating esotropia: Secondary | ICD-10-CM | POA: Diagnosis not present

## 2017-10-06 DIAGNOSIS — Z7982 Long term (current) use of aspirin: Secondary | ICD-10-CM | POA: Diagnosis not present

## 2017-10-06 HISTORY — DX: Other specified postprocedural states: Z98.890

## 2017-10-06 HISTORY — PX: STRABISMUS SURGERY: SHX218

## 2017-10-06 HISTORY — DX: Nausea with vomiting, unspecified: R11.2

## 2017-10-06 SURGERY — REPAIR STRABISMUS
Anesthesia: General | Site: Eye | Laterality: Bilateral

## 2017-10-06 MED ORDER — PROPOFOL 10 MG/ML IV BOLUS
INTRAVENOUS | Status: DC | PRN
  Administered 2017-10-06: 200 mg via INTRAVENOUS

## 2017-10-06 MED ORDER — EPHEDRINE SULFATE 50 MG/ML IJ SOLN
INTRAMUSCULAR | Status: AC
  Filled 2017-10-06: qty 1

## 2017-10-06 MED ORDER — FENTANYL CITRATE (PF) 100 MCG/2ML IJ SOLN
50.0000 ug | INTRAMUSCULAR | Status: DC | PRN
  Administered 2017-10-06: 50 ug via INTRAVENOUS
  Administered 2017-10-06: 100 ug via INTRAVENOUS

## 2017-10-06 MED ORDER — PROPOFOL 500 MG/50ML IV EMUL
INTRAVENOUS | Status: DC | PRN
  Administered 2017-10-06: 150 ug/kg/min via INTRAVENOUS

## 2017-10-06 MED ORDER — OXYCODONE HCL 5 MG PO TABS: 5.0000 mg | ORAL_TABLET | Freq: Once | ORAL | Status: DC | PRN

## 2017-10-06 MED ORDER — EPHEDRINE SULFATE 50 MG/ML IJ SOLN
INTRAMUSCULAR | Status: DC | PRN
  Administered 2017-10-06: 10 mg via INTRAVENOUS

## 2017-10-06 MED ORDER — TOBRAMYCIN-DEXAMETHASONE 0.3-0.1 % OP OINT
TOPICAL_OINTMENT | OPHTHALMIC | Status: DC | PRN
  Administered 2017-10-06: 1 via OPHTHALMIC

## 2017-10-06 MED ORDER — LIDOCAINE HCL (CARDIAC) PF 100 MG/5ML IV SOSY
PREFILLED_SYRINGE | INTRAVENOUS | Status: AC
  Filled 2017-10-06: qty 5

## 2017-10-06 MED ORDER — MIDAZOLAM HCL 2 MG/2ML IJ SOLN
INTRAMUSCULAR | Status: AC
  Filled 2017-10-06: qty 2

## 2017-10-06 MED ORDER — KETOROLAC TROMETHAMINE 30 MG/ML IJ SOLN
INTRAMUSCULAR | Status: AC
  Filled 2017-10-06: qty 1

## 2017-10-06 MED ORDER — FENTANYL CITRATE (PF) 100 MCG/2ML IJ SOLN
INTRAMUSCULAR | Status: AC
  Filled 2017-10-06: qty 2

## 2017-10-06 MED ORDER — LIDOCAINE HCL (CARDIAC) PF 50 MG/5ML IV SOSY
PREFILLED_SYRINGE | INTRAVENOUS | Status: DC | PRN
  Administered 2017-10-06: 40 mg via INTRAVENOUS

## 2017-10-06 MED ORDER — FENTANYL CITRATE (PF) 100 MCG/2ML IJ SOLN: 25.0000 ug | INTRAMUSCULAR | Status: DC | PRN

## 2017-10-06 MED ORDER — GLYCOPYRROLATE 0.2 MG/ML IJ SOLN
INTRAMUSCULAR | Status: DC | PRN
  Administered 2017-10-06: 0.4 mg via INTRAVENOUS

## 2017-10-06 MED ORDER — OXYCODONE HCL 5 MG/5ML PO SOLN: 5.0000 mg | Freq: Once | ORAL | Status: DC | PRN

## 2017-10-06 MED ORDER — SCOPOLAMINE 1 MG/3DAYS TD PT72: 1.0000 | MEDICATED_PATCH | Freq: Once | TRANSDERMAL | Status: DC | PRN

## 2017-10-06 MED ORDER — PROMETHAZINE HCL 25 MG/ML IJ SOLN: 6.2500 mg | INTRAMUSCULAR | Status: DC | PRN

## 2017-10-06 MED ORDER — DEXAMETHASONE SODIUM PHOSPHATE 10 MG/ML IJ SOLN
INTRAMUSCULAR | Status: AC
  Filled 2017-10-06: qty 1

## 2017-10-06 MED ORDER — ONDANSETRON HCL 4 MG/2ML IJ SOLN
INTRAMUSCULAR | Status: AC
  Filled 2017-10-06: qty 2

## 2017-10-06 MED ORDER — DEXAMETHASONE SODIUM PHOSPHATE 4 MG/ML IJ SOLN
INTRAMUSCULAR | Status: DC | PRN
  Administered 2017-10-06: 10 mg via INTRAVENOUS

## 2017-10-06 MED ORDER — KETOROLAC TROMETHAMINE 30 MG/ML IJ SOLN
INTRAMUSCULAR | Status: DC | PRN
  Administered 2017-10-06: 30 mg via INTRAVENOUS

## 2017-10-06 MED ORDER — LACTATED RINGERS IV SOLN
INTRAVENOUS | Status: DC
  Administered 2017-10-06: 10 mL/h via INTRAVENOUS

## 2017-10-06 MED ORDER — ARTIFICIAL TEARS OPHTHALMIC OINT
TOPICAL_OINTMENT | OPHTHALMIC | Status: AC
  Filled 2017-10-06: qty 3.5

## 2017-10-06 MED ORDER — MEPERIDINE HCL 25 MG/ML IJ SOLN: 6.2500 mg | INTRAMUSCULAR | Status: DC | PRN

## 2017-10-06 MED ORDER — MIDAZOLAM HCL 2 MG/2ML IJ SOLN
1.0000 mg | INTRAMUSCULAR | Status: DC | PRN
  Administered 2017-10-06: 2 mg via INTRAVENOUS

## 2017-10-06 MED ORDER — ONDANSETRON HCL 4 MG/2ML IJ SOLN
INTRAMUSCULAR | Status: DC | PRN
  Administered 2017-10-06: 4 mg via INTRAVENOUS

## 2017-10-06 SURGICAL SUPPLY — 32 items
APL SRG 3 HI ABS STRL LF PLS (MISCELLANEOUS) ×1
APL SWBSTK 6 STRL LF DISP (MISCELLANEOUS) ×4
APPLICATOR COTTON TIP 6 STRL (MISCELLANEOUS) ×4 IMPLANT
APPLICATOR COTTON TIP 6IN STRL (MISCELLANEOUS) ×8
APPLICATOR DR MATTHEWS STRL (MISCELLANEOUS) ×2 IMPLANT
BANDAGE EYE OVAL (MISCELLANEOUS) IMPLANT
CAUTERY EYE LOW TEMP 1300F FIN (OPHTHALMIC RELATED) IMPLANT
COVER BACK TABLE 60X90IN (DRAPES) ×2 IMPLANT
COVER MAYO STAND STRL (DRAPES) ×2 IMPLANT
DRAPE SURG 17X23 STRL (DRAPES) ×4 IMPLANT
DRAPE U-SHAPE 76X120 STRL (DRAPES) IMPLANT
GLOVE BIO SURGEON STRL SZ 6.5 (GLOVE) ×4 IMPLANT
GLOVE BIOGEL M STRL SZ7.5 (GLOVE) ×2 IMPLANT
GOWN STRL REUS W/ TWL LRG LVL3 (GOWN DISPOSABLE) ×1 IMPLANT
GOWN STRL REUS W/TWL LRG LVL3 (GOWN DISPOSABLE) ×2
GOWN STRL REUS W/TWL XL LVL3 (GOWN DISPOSABLE) ×4 IMPLANT
NS IRRIG 1000ML POUR BTL (IV SOLUTION) ×2 IMPLANT
PACK BASIN DAY SURGERY FS (CUSTOM PROCEDURE TRAY) ×2 IMPLANT
SHEET MEDIUM DRAPE 40X70 STRL (DRAPES) IMPLANT
SPEAR EYE SURG WECK-CEL (MISCELLANEOUS) ×4 IMPLANT
STRIP CLOSURE SKIN 1/4X4 (GAUZE/BANDAGES/DRESSINGS) IMPLANT
SUT 6 0 SILK T G140 8DA (SUTURE) ×1 IMPLANT
SUT MERSILENE 6-0 18IN S14 8MM (SUTURE)
SUT PLAIN 6 0 TG1408 (SUTURE) ×2 IMPLANT
SUT SILK 4 0 C 3 735G (SUTURE) IMPLANT
SUT VICRYL 6 0 S 28 (SUTURE) IMPLANT
SUT VICRYL ABS 6-0 S29 18IN (SUTURE) ×3 IMPLANT
SUTURE MERSLN 6-0 18IN S14 8MM (SUTURE) IMPLANT
SYR 10ML LL (SYRINGE) ×2 IMPLANT
SYR TB 1ML LL NO SAFETY (SYRINGE) ×2 IMPLANT
TOWEL GREEN STERILE FF (TOWEL DISPOSABLE) ×2 IMPLANT
TRAY DSU PREP LF (CUSTOM PROCEDURE TRAY) ×2 IMPLANT

## 2017-10-06 NOTE — H&P (Signed)
Date of examination:  09-11-17  Indication for surgery: to straighten the eyes and relieve diplopia  Pertinent past medical history:  Past Medical History:  Diagnosis Date  . Complication of anesthesia    per pt, sensitive to sedation/ B/P drops!  . Hyperlipidemia   . Osteoarthritis   . PONV (postoperative nausea and vomiting)     Pertinent ocular history:  Diplopia x 7 years, recurrent despite prism in glasses for high myopia, , now pseudophakic and would like to avoid prism if possible  Pertinent family history:  Family History  Problem Relation Age of Onset  . Lung cancer Mother   . Hyperlipidemia Mother   . Diabetes Maternal Aunt   . Heart attack Father   . Healthy Brother   . Healthy Son   . Hypertension Neg Hx     General:  Healthy appearing patient in no distress.    Eyes:    Acuity [place]  OD 20/25  OS 20/25  External: Within normal limits     Anterior segment: Within normal limits x PC IOL OU  Motility:   ET=20 comitant, ET'=15, no visible limitation of abduction, fuses with 20 pd BO  Fundus: deferred  Refraction: plano OU approx (was high myope)  Heart: Regular rate and rhythm without murmur     Lungs: Clear to auscultation      Impression:Esotropia, divergence insufficiency type,  High myope now approx plano (PC IOL)  Plan: Medial rectus muscle recession both eyes, one adjustable.  Will operate for more than the measured angle because divergence insufficiency  Derry Skill

## 2017-10-06 NOTE — Anesthesia Procedure Notes (Signed)
Procedure Name: LMA Insertion Performed by: Terrance Mass, CRNA Pre-anesthesia Checklist: Patient identified, Emergency Drugs available, Suction available and Patient being monitored Patient Re-evaluated:Patient Re-evaluated prior to induction Oxygen Delivery Method: Circle system utilized Preoxygenation: Pre-oxygenation with 100% oxygen Induction Type: IV induction Ventilation: Mask ventilation without difficulty LMA: LMA flexible inserted LMA Size: 4.0 Number of attempts: 2 Placement Confirmation: positive ETCO2 Tube secured with: Tape Dental Injury: Teeth and Oropharynx as per pre-operative assessment

## 2017-10-06 NOTE — Op Note (Signed)
10/06/2017  10:36 AM  PATIENT:  Kristopher Holden  62 y.o. male  PRE-OPERATIVE DIAGNOSIS:  Esotropia, divergence insufficiency type  POST-OPERATIVE DIAGNOSIS:  Same   PROCEDURE:  1. Medial rectus muscle recession  5.0 mm right eye   2.  Medial rectus muscle recession 5.0 mm left eye, adjustable  SURGEON:  Lorne Skeens.Annamaria Boots, M.D.   ANESTHESIA:   general  COMPLICATIONS:None  DESCRIPTION OF PROCEDURE: The patient was taken to the operating room where He was identified by me. General anesthesia was induced without difficulty after placement of appropriate monitors. The patient was prepped and draped in standard sterile fashion. A lid speculum was placed in the right eye.  Through an inferonasal fornix incision through conjunctiva and Tenon's fascia, the right medial rectus muscle was engaged on a series of muscle hooks and cleared of its fascial attachments. The tendon was secured with a double-armed 6-0 Vicryl suture with a double locking bite at each border of the muscle, 1 mm from the insertion. The muscle was disinserted, and was reattached to sclera at a measured distance of 5.0 millimeters posterior to the original insertion, using direct scleral passes in crossed swords fashion.  The suture ends were tied securely after the position of the muscle had been checked and found to be accurate. Conjunctiva was closed with 2 6-0 Vicryl sutures.  The speculum was transferred to the left eye, where after placing a 6-0 silk traction suture at the nasal limbus, the medial rectus muscle was identified, secured, and disinserted just as described for the right eye.  Each pole suture was passed back into the original muscle stop and crossed swords fashion.  The muscle was drawn up to the level of the original insertion.  With the muscle held in this position, the pole sutures were tied together approximately 10 cm above sclera.  The pole sutures were then joined with a needle driver at a measured to a distance of  5.0 mm above sclera.  A noose knot of 6-0 Vicryl was tied securely around the pole sutures at this location.  The muscle was then allowed to hang back until the news not reached sclera, effecting an identical procedure was performed, effecting an adjustable 5.0 millimeters recession of the medial rectus muscle.  The edges of the conjunctival incision were very loosely opposed with a large loop of 6-0 plain gut.  The pole, noose, traction, and conjunctival sutures were taped to the cheek with Steri-Strips.  TobraDex ointment was placed in both eye(s). A sterile patch was placed over the left eye.  The patient was awakened without difficulty and taken to the recovery room in stable condition, having suffered no intraoperative or immediate postoperative complications.  Lorne Skeens. Willard Farquharson M.D.    PATIENT DISPOSITION:  PACU - hemodynamically stable.

## 2017-10-06 NOTE — Anesthesia Preprocedure Evaluation (Signed)
Anesthesia Evaluation  Patient identified by MRN, date of birth, ID band Patient awake    Reviewed: Allergy & Precautions, NPO status , Patient's Chart, lab work & pertinent test results  History of Anesthesia Complications (+) PONV and history of anesthetic complications  Airway Mallampati: II  TM Distance: >3 FB Neck ROM: Full    Dental  (+) Dental Advisory Given   Pulmonary neg pulmonary ROS,    Pulmonary exam normal breath sounds clear to auscultation       Cardiovascular negative cardio ROS Normal cardiovascular exam Rhythm:Regular Rate:Normal     Neuro/Psych negative neurological ROS  negative psych ROS   GI/Hepatic negative GI ROS, Neg liver ROS,   Endo/Other  negative endocrine ROS  Renal/GU negative Renal ROS     Musculoskeletal  (+) Arthritis ,   Abdominal   Peds  Hematology negative hematology ROS (+)   Anesthesia Other Findings   Reproductive/Obstetrics                             Anesthesia Physical Anesthesia Plan  ASA: II  Anesthesia Plan: General   Post-op Pain Management:    Induction: Intravenous  PONV Risk Score and Plan: 3 and Ondansetron, Dexamethasone, Propofol infusion, Midazolam and Treatment may vary due to age or medical condition  Airway Management Planned: LMA  Additional Equipment:   Intra-op Plan:   Post-operative Plan: Extubation in OR  Informed Consent: I have reviewed the patients History and Physical, chart, labs and discussed the procedure including the risks, benefits and alternatives for the proposed anesthesia with the patient or authorized representative who has indicated his/her understanding and acceptance.   Dental advisory given  Plan Discussed with: CRNA  Anesthesia Plan Comments:         Anesthesia Quick Evaluation

## 2017-10-06 NOTE — Transfer of Care (Signed)
Immediate Anesthesia Transfer of Care Note  Patient: Kristopher Holden  Procedure(s) Performed: REPAIR STRABISMUS (Bilateral Eye)  Patient Location: PACU  Anesthesia Type:General  Level of Consciousness: awake and sedated  Airway & Oxygen Therapy: Patient Spontanous Breathing and Patient connected to face mask oxygen  Post-op Assessment: Report given to RN and Post -op Vital signs reviewed and stable  Post vital signs: Reviewed and stable  Last Vitals:  Vitals Value Taken Time  BP    Temp    Pulse 72 10/06/2017 10:36 AM  Resp 16 10/06/2017 10:36 AM  SpO2 99 % 10/06/2017 10:36 AM  Vitals shown include unvalidated device data.  Last Pain:  Vitals:   10/06/17 0742  TempSrc: Oral         Complications: No apparent anesthesia complications

## 2017-10-06 NOTE — H&P (Deleted)
  The note originally documented on this encounter has been moved the the encounter in which it belongs.  

## 2017-10-06 NOTE — Discharge Instructions (Signed)
Diet: Clear liquids, advance to soft foods then regular diet as tolerated by this evening.  Pain control:   1)  Ibuprofen 600 mg by mouth every 6-8 hours as needed for pain  2)  Ice pack/cold compress to operated eye(s) as desired  Eye medications:  Tobradex or Zylet eye ointment 1/2 inch in operated eye(s) twice a day if directed to do so by Dr. Annamaria Boots  Activity: No swimming for 1 week.  It is OK to let water run over the face and eyes while showering or taking a bath, even during the first week.  No other restriction on exercise or activity.  If there is a patch on one eye, leave it in place until seen in Dr. Janee Morn office this afternoon for suture adjustment.  If there is no patch, you do not need to come to the office this afternoon; just come for your scheduled postop appointment.  Call Dr. Janee Morn office [phone] with any problems or concerns.    Call your surgeon if you experience:   1.  Fever over 101.0. 2.  Inability to urinate. 3.  Nausea and/or vomiting. 4.  Extreme swelling or bruising at the surgical site. 5.  Continued bleeding from the incision. 6.  Increased pain, redness or drainage from the incision. 7.  Problems related to your pain medication. 8.  Any problems and/or concerns    Post Anesthesia Home Care Instructions  Activity: Get plenty of rest for the remainder of the day. A responsible individual must stay with you for 24 hours following the procedure.  For the next 24 hours, DO NOT: -Drive a car -Paediatric nurse -Drink alcoholic beverages -Take any medication unless instructed by your physician -Make any legal decisions or sign important papers.  Meals: Start with liquid foods such as gelatin or soup. Progress to regular foods as tolerated. Avoid greasy, spicy, heavy foods. If nausea and/or vomiting occur, drink only clear liquids until the nausea and/or vomiting subsides. Call your physician if vomiting continues.  Special  Instructions/Symptoms: Your throat may feel dry or sore from the anesthesia or the breathing tube placed in your throat during surgery. If this causes discomfort, gargle with warm salt water. The discomfort should disappear within 24 hours.  If you had a scopolamine patch placed behind your ear for the management of post- operative nausea and/or vomiting:  1. The medication in the patch is effective for 72 hours, after which it should be removed.  Wrap patch in a tissue and discard in the trash. Wash hands thoroughly with soap and water. 2. You may remove the patch earlier than 72 hours if you experience unpleasant side effects which may include dry mouth, dizziness or visual disturbances. 3. Avoid touching the patch. Wash your hands with soap and water after contact with the patch.

## 2017-10-06 NOTE — Anesthesia Postprocedure Evaluation (Signed)
Anesthesia Post Note  Patient: Kristopher Holden  Procedure(s) Performed: REPAIR STRABISMUS (Bilateral Eye)     Patient location during evaluation: PACU Anesthesia Type: General Level of consciousness: sedated and patient cooperative Pain management: pain level controlled Vital Signs Assessment: post-procedure vital signs reviewed and stable Respiratory status: spontaneous breathing Cardiovascular status: stable Anesthetic complications: no    Last Vitals:  Vitals:   10/06/17 1115 10/06/17 1145  BP: 99/60 103/72  Pulse: 62 (!) 59  Resp: 13 16  Temp:  36.5 C  SpO2: 97% 94%    Last Pain:  Vitals:   10/06/17 1145  TempSrc:   PainSc: 0-No pain                 Nolon Nations

## 2017-10-09 ENCOUNTER — Encounter (HOSPITAL_BASED_OUTPATIENT_CLINIC_OR_DEPARTMENT_OTHER): Payer: Self-pay | Admitting: Ophthalmology

## 2017-10-11 DIAGNOSIS — Z0000 Encounter for general adult medical examination without abnormal findings: Secondary | ICD-10-CM | POA: Diagnosis not present

## 2017-10-11 DIAGNOSIS — Z1389 Encounter for screening for other disorder: Secondary | ICD-10-CM | POA: Diagnosis not present

## 2017-10-11 DIAGNOSIS — M1990 Unspecified osteoarthritis, unspecified site: Secondary | ICD-10-CM | POA: Diagnosis not present

## 2017-10-11 DIAGNOSIS — R200 Anesthesia of skin: Secondary | ICD-10-CM | POA: Diagnosis not present

## 2017-10-11 DIAGNOSIS — E7849 Other hyperlipidemia: Secondary | ICD-10-CM | POA: Diagnosis not present

## 2017-10-17 DIAGNOSIS — M25561 Pain in right knee: Secondary | ICD-10-CM | POA: Diagnosis not present

## 2017-10-17 DIAGNOSIS — R531 Weakness: Secondary | ICD-10-CM | POA: Diagnosis not present

## 2018-04-03 DIAGNOSIS — L570 Actinic keratosis: Secondary | ICD-10-CM | POA: Diagnosis not present

## 2018-04-03 DIAGNOSIS — D225 Melanocytic nevi of trunk: Secondary | ICD-10-CM | POA: Diagnosis not present

## 2018-04-03 DIAGNOSIS — L738 Other specified follicular disorders: Secondary | ICD-10-CM | POA: Diagnosis not present

## 2018-04-10 DIAGNOSIS — H5032 Intermittent alternating esotropia: Secondary | ICD-10-CM | POA: Diagnosis not present

## 2018-05-31 DIAGNOSIS — Z96651 Presence of right artificial knee joint: Secondary | ICD-10-CM | POA: Diagnosis not present

## 2018-05-31 DIAGNOSIS — M25461 Effusion, right knee: Secondary | ICD-10-CM | POA: Diagnosis not present

## 2018-05-31 DIAGNOSIS — Z96641 Presence of right artificial hip joint: Secondary | ICD-10-CM | POA: Diagnosis not present

## 2018-05-31 DIAGNOSIS — M25562 Pain in left knee: Secondary | ICD-10-CM | POA: Diagnosis not present

## 2018-05-31 DIAGNOSIS — M25462 Effusion, left knee: Secondary | ICD-10-CM | POA: Diagnosis not present

## 2018-05-31 DIAGNOSIS — Z471 Aftercare following joint replacement surgery: Secondary | ICD-10-CM | POA: Diagnosis not present

## 2018-05-31 DIAGNOSIS — M1712 Unilateral primary osteoarthritis, left knee: Secondary | ICD-10-CM | POA: Diagnosis not present

## 2018-07-27 ENCOUNTER — Encounter (HOSPITAL_COMMUNITY): Payer: Self-pay | Admitting: Emergency Medicine

## 2018-07-27 ENCOUNTER — Emergency Department (HOSPITAL_COMMUNITY)
Admission: EM | Admit: 2018-07-27 | Discharge: 2018-07-28 | Disposition: A | Discharge status: Home / Self Care | Payer: BLUE CROSS/BLUE SHIELD | Attending: Emergency Medicine | Admitting: Emergency Medicine

## 2018-07-27 ENCOUNTER — Emergency Department (HOSPITAL_COMMUNITY): Payer: BLUE CROSS/BLUE SHIELD

## 2018-07-27 ENCOUNTER — Other Ambulatory Visit: Payer: Self-pay

## 2018-07-27 DIAGNOSIS — Z79899 Other long term (current) drug therapy: Secondary | ICD-10-CM | POA: Insufficient documentation

## 2018-07-27 DIAGNOSIS — R509 Fever, unspecified: Secondary | ICD-10-CM | POA: Insufficient documentation

## 2018-07-27 DIAGNOSIS — Z20828 Contact with and (suspected) exposure to other viral communicable diseases: Secondary | ICD-10-CM | POA: Diagnosis not present

## 2018-07-27 DIAGNOSIS — D72810 Lymphocytopenia: Secondary | ICD-10-CM | POA: Diagnosis not present

## 2018-07-27 DIAGNOSIS — R05 Cough: Secondary | ICD-10-CM | POA: Insufficient documentation

## 2018-07-27 DIAGNOSIS — R0602 Shortness of breath: Secondary | ICD-10-CM | POA: Insufficient documentation

## 2018-07-27 DIAGNOSIS — R6889 Other general symptoms and signs: Secondary | ICD-10-CM

## 2018-07-27 DIAGNOSIS — Z20822 Contact with and (suspected) exposure to covid-19: Secondary | ICD-10-CM

## 2018-07-27 MED ORDER — SODIUM CHLORIDE 0.9 % IV BOLUS
1000.0000 mL | Freq: Once | INTRAVENOUS | Status: AC
  Administered 2018-07-28: 1000 mL via INTRAVENOUS

## 2018-07-27 MED ORDER — GUAIFENESIN-CODEINE 100-10 MG/5ML PO SOLN
10.0000 mL | Freq: Once | ORAL | Status: AC
  Administered 2018-07-28: 10 mL via ORAL
  Filled 2018-07-27: qty 10

## 2018-07-27 NOTE — ED Triage Notes (Signed)
Patient is complaining of fever and cough. Patient has been sick since last Tuesday. Patient states he does not have pain. He just want to stop coughing.

## 2018-07-28 LAB — CBC WITH DIFFERENTIAL/PLATELET
Abs Immature Granulocytes: 0.03 10*3/uL (ref 0.00–0.07)
Basophils Absolute: 0 10*3/uL (ref 0.0–0.1)
Basophils Relative: 0 %
Eosinophils Absolute: 0 10*3/uL (ref 0.0–0.5)
Eosinophils Relative: 0 %
HEMATOCRIT: 40.3 % (ref 39.0–52.0)
HEMOGLOBIN: 13.4 g/dL (ref 13.0–17.0)
Immature Granulocytes: 0 %
LYMPHS PCT: 8 %
Lymphs Abs: 0.6 10*3/uL — ABNORMAL LOW (ref 0.7–4.0)
MCH: 30.7 pg (ref 26.0–34.0)
MCHC: 33.3 g/dL (ref 30.0–36.0)
MCV: 92.2 fL (ref 80.0–100.0)
Monocytes Absolute: 0.7 10*3/uL (ref 0.1–1.0)
Monocytes Relative: 9 %
Neutro Abs: 6.3 10*3/uL (ref 1.7–7.7)
Neutrophils Relative %: 83 %
Platelets: 159 10*3/uL (ref 150–400)
RBC: 4.37 MIL/uL (ref 4.22–5.81)
RDW: 12.8 % (ref 11.5–15.5)
WBC: 7.7 10*3/uL (ref 4.0–10.5)
nRBC: 0 % (ref 0.0–0.2)

## 2018-07-28 LAB — COMPREHENSIVE METABOLIC PANEL
ALT: 20 U/L (ref 0–44)
AST: 36 U/L (ref 15–41)
Albumin: 3.4 g/dL — ABNORMAL LOW (ref 3.5–5.0)
Alkaline Phosphatase: 56 U/L (ref 38–126)
Anion gap: 12 (ref 5–15)
BUN: 19 mg/dL (ref 8–23)
CO2: 22 mmol/L (ref 22–32)
Calcium: 8.2 mg/dL — ABNORMAL LOW (ref 8.9–10.3)
Chloride: 102 mmol/L (ref 98–111)
Creatinine, Ser: 1.2 mg/dL (ref 0.61–1.24)
GFR calc non Af Amer: >60 mL/min (ref >60)
Glucose, Bld: 125 mg/dL — ABNORMAL HIGH (ref 70–99)
Potassium: 3.8 mmol/L (ref 3.5–5.1)
Sodium: 136 mmol/L (ref 135–145)
Total Bilirubin: 1 mg/dL (ref 0.3–1.2)
Total Protein: 7.2 g/dL (ref 6.5–8.1)

## 2018-07-28 LAB — URINALYSIS, ROUTINE W REFLEX MICROSCOPIC
Bilirubin Urine: NEGATIVE
Glucose, UA: NEGATIVE mg/dL
Ketones, ur: 5 mg/dL — AB
Leukocytes,Ua: NEGATIVE
Nitrite: NEGATIVE
PH: 5 (ref 5.0–8.0)
Protein, ur: 30 mg/dL — AB
Specific Gravity, Urine: 1.029 (ref 1.005–1.030)

## 2018-07-28 LAB — C-REACTIVE PROTEIN: CRP: 10.7 mg/dL — ABNORMAL HIGH (ref <1.0)

## 2018-07-28 LAB — LACTATE DEHYDROGENASE: LDH: 245 U/L — ABNORMAL HIGH (ref 98–192)

## 2018-07-28 MED ORDER — ACETAMINOPHEN 325 MG PO TABS
650.0000 mg | ORAL_TABLET | Freq: Once | ORAL | Status: AC
  Administered 2018-07-28: 650 mg via ORAL
  Filled 2018-07-28: qty 2

## 2018-07-28 NOTE — Discharge Instructions (Addendum)
We saw in the ER for cough and shortness of breath.  This x-ray does not show any signs of pneumonia.  As discussed, continue with the antibiotics that were prescribed and finish the course.  You are considered to be low risk for coronavirus infection at this time  -meaning you do not have any high risk features to have contracted the virus.  However, as the number of cases in our area goes up we are suspecting that the high risk criteria might change.  For now the recommendations for you are to stay in self quarantine for at least 7 days after your symptoms began or for 3 days constant of feeling normal and being fever free despite taking medications  Return to the emergency room immediately if you start having shortness of breath, confusion, fainting spell.  Coronavirus (COVID-19) Are you at risk?  Are you at risk for the Coronavirus (COVID-19)?  To be considered HIGH RISK for Coronavirus (COVID-19), you have to meet the following criteria:  Traveled to [place], [place], [place], [place] or [place]; or in the [place] to [place], [place], [place], or [place]; and have fever, cough, and shortness of breath within the last 2 weeks of travel OR Been in close contact with a person diagnosed with COVID-19 within the last 2 weeks and have fever, cough, and shortness of breath IF YOU DO NOT MEET THESE CRITERIA, YOU ARE CONSIDERED LOW RISK FOR COVID-19.  What to do if you are HIGH RISK for COVID-19?  If you are having a medical emergency, call 911. Seek medical care right away. Before you go to a doctors office, urgent care or emergency department, call ahead and tell them about your recent travel, contact with someone diagnosed with COVID-19, and your symptoms. You should receive instructions from your physicians office regarding next steps of care.  When you arrive at healthcare provider, tell the healthcare staff immediately you have returned from visiting [place], [place], [place], [place] or  [place]; or traveled in the [place] to [place], [place], [place], or [place]; in the last two weeks or you have been in close contact with a person diagnosed with COVID-19 in the last 2 weeks.   Tell the health care staff about your symptoms: fever, cough and shortness of breath. After you have been seen by a medical provider, you will be either: Tested for (COVID-19) and discharged home on quarantine except to seek medical care if symptoms worsen, and asked to  Stay home and avoid contact with others until you get your results (4-5 days)  Avoid travel on public transportation if possible (such as bus, train, or airplane) or Sent to the Emergency Department by EMS for evaluation, COVID-19 testing, and possible admission depending on your condition and test results.  What to do if you are LOW RISK for COVID-19?  Reduce your risk of any infection by using the same precautions used for avoiding the common cold or flu:  Wash your hands often with soap and warm water for at least 20 seconds.  If soap and water are not readily available, use an alcohol-based hand sanitizer with at least 60% alcohol.  If coughing or sneezing, cover your mouth and nose by coughing or sneezing into the elbow areas of your shirt or coat, into a tissue or into your sleeve (not your hands). Avoid shaking hands with others and consider head nods or verbal greetings only. Avoid touching your eyes, nose, or mouth with unwashed  hands.  Avoid close contact with people who are sick. Avoid places or events with large numbers of people in one location, like concerts or sporting events. Carefully consider travel plans you have or are making. If you are planning any travel outside or inside the [place], visit the [place] webpage for the latest health notices. If you have some symptoms but not all symptoms, continue to monitor at home and seek medical attention if your symptoms worsen. If you are having  a medical emergency, call 911.   [place] / e-Visit: [email or web address]         [facility]: [facility]: [phone]                   [facility]: [phone]

## 2018-07-28 NOTE — ED Provider Notes (Signed)
[facility] DEPT Provider Note   CSN: [id number] Arrival date & time: 07/27/18  2220    History   Chief Complaint Chief Complaint  Patient presents with  . Cough  . Fever    HPI Antawn Sison is a 63 y.o. male.     HPI  63 year old male comes in with chief complaint of cough and fever. Patient reports he has been having cough for the last 10 days.  For the past few days the cough has become progressive and it is worse at nighttime.  Patient also has started having fevers usually at nighttime.  He denies any shortness of breath at this time.  Patient denies any URI-like symptoms besides the cough and also denies any myalgias.  Patient is already on Levaquin.  He has been in touch with his PCP, who advised him to come to the ER for further evaluation.   Past Medical History:  Diagnosis Date  . Complication of anesthesia    per pt, sensitive to sedation/ B/P drops!  . Hyperlipidemia   . Osteoarthritis    knee and hip  . PONV (postoperative nausea and vomiting)    lowers BP and severe nausea    Patient Active Problem List   Diagnosis Date Noted  . Right Achilles tendinitis 04/10/2012  . Acute bacterial bronchitis 12/30/2009  . OSTEOARTHROSIS, LOCAL NOS, PELVIS/THIGH 01/22/2007  . OSTEOARTHRITIS 01/22/2007  . HYPERLIPIDEMIA 10/27/2006    Past Surgical History:  Procedure Laterality Date  . COLONOSCOPY    . EYE SURGERY    . KNEE SURGERY  1991   partial acl tear reconstruction/ left knee  . STRABISMUS SURGERY Bilateral 10/06/2017   Procedure: REPAIR STRABISMUS;  Surgeon: Everitt Amber, MD;  Location: [facility];  Service: Ophthalmology;  Laterality: Bilateral;  . total hip resurface   2009   right        Home Medications    Prior to Admission medications   Medication Sig Start Date End Date Taking? Authorizing Provider  acetaminophen (TYLENOL) 325 MG tablet Take 650 mg by mouth every 6 (six) hours as needed for  moderate pain.   Yes [provider]  atorvastatin (LIPITOR) 20 MG tablet TAKE 1 TABLET ONCE DAILY. Patient taking differently: Take 20 mg by mouth daily at 6 PM.    Yes Ricard Dillon, MD  budesonide-formoterol [facility]) 160-4.5 MCG/ACT inhaler Inhale 2 puffs into the lungs 2 (two) times daily.  07/26/18  Yes [provider]  cetirizine (ZYRTEC) 10 MG tablet Take 10 mg by mouth daily.   Yes [provider]  fluticasone (FLONASE) 50 MCG/ACT nasal spray Place 1 spray into both nostrils daily.   Yes [provider]  ibuprofen (ADVIL,MOTRIN) 200 MG tablet Take 400 mg by mouth every 6 (six) hours as needed for moderate pain.   Yes [provider]  levofloxacin (LEVAQUIN) 500 MG tablet Take 500 mg by mouth daily.  07/26/18  Yes [provider]  fish oil-omega-3 fatty acids 1000 MG capsule Take 2 g by mouth 2 (two) times daily. Take 2 bid     [provider]  sildenafil (VIAGRA) 100 MG tablet Take 100 mg by mouth. Take 1/4 pills occasionally    [provider]    Family History Family History  Problem Relation Age of Onset  . Lung cancer Mother   . Hyperlipidemia Mother   . Diabetes Maternal Aunt   . Heart attack Father   . Healthy Brother   .  Healthy Son   . Hypertension Neg Hx     Social History Social History   Tobacco Use  . Smoking status: Never Smoker  . Smokeless tobacco: Never Used  Substance Use Topics  . Alcohol use: Yes    Alcohol/week: 5.0 - 6.0 standard drinks    Types: 5 - 6 Standard drinks or equivalent per week    Comment: 0-2 nightly  . Drug use: No     Allergies   Penicillins   Review of Systems Review of Systems  Constitutional: Positive for activity change and fever.  Respiratory: Positive for cough and shortness of breath.   Gastrointestinal: Negative for vomiting.  Allergic/Immunologic: Negative for environmental allergies.  All other systems reviewed and are negative.     Physical Exam Updated Vital Signs BP 110/64 (BP Location: Left Arm)   Pulse 79   Temp 99.8 F (37.7 C) (Oral)   Resp 19   Ht 5\' 11"  (1.803 m)   Wt 85.7 kg   SpO2 97%   BMI 26.36 kg/m   Physical Exam Vitals signs and nursing note reviewed.  Constitutional:      Appearance: He is well-developed.  HENT:     Head: Atraumatic.  Neck:     Musculoskeletal: Neck supple.  Cardiovascular:     Rate and Rhythm: Normal rate.  Pulmonary:     Effort: Pulmonary effort is normal. No respiratory distress.     Breath sounds: No wheezing or rhonchi.  Skin:    General: Skin is warm.  Neurological:     Mental Status: He is alert and oriented to person, place, and time.      ED Treatments / Results  Labs (all labs ordered are listed, but only abnormal results are displayed) Labs Reviewed  COMPREHENSIVE METABOLIC PANEL - Abnormal; Notable for the following components:      Result Value   Glucose, Bld 125 (*)    Calcium 8.2 (*)    Albumin 3.4 (*)    All other components within normal limits  CBC WITH DIFFERENTIAL/PLATELET - Abnormal; Notable for the following components:   Lymphs Abs 0.6 (*)    All other components within normal limits  LACTATE DEHYDROGENASE - Abnormal; Notable for the following components:   LDH 245 (*)    All other components within normal limits  C-REACTIVE PROTEIN - Abnormal; Notable for the following components:   CRP 10.7 (*)    All other components within normal limits  URINALYSIS, ROUTINE W REFLEX MICROSCOPIC - Abnormal; Notable for the following components:   APPearance HAZY (*)    Hgb urine dipstick SMALL (*)    Ketones, ur 5 (*)    Protein, ur 30 (*)    Bacteria, UA RARE (*)    All other components within normal limits    EKG None  Radiology Dg Chest 2 View  Result Date: 07/27/2018 CLINICAL DATA:  Cough and fever. Shortness of breath. EXAM: CHEST - 2 VIEW COMPARISON:  07/08/2013 FINDINGS: The cardiomediastinal contours are normal. The lungs are  clear. Pulmonary vasculature is normal. No consolidation, pleural effusion, or pneumothorax. No acute osseous abnormalities are seen. IMPRESSION: No acute chest findings. Electronically Signed   By: Keith Rake M.D.   On: 07/27/2018 23:07    Procedures Procedures (including critical care time)  Medications Ordered in ED Medications  sodium chloride 0.9 % bolus 1,000 mL (0 mLs Intravenous Stopped 07/28/18 0253)  guaiFENesin-codeine 100-10 MG/5ML solution 10 mL (10 mLs Oral Given 07/28/18 0143)  acetaminophen (  TYLENOL) tablet 650 mg (650 mg Oral Given 07/28/18 0144)     Initial Impression / Assessment and Plan / ED Course  I have reviewed the triage vital signs and the nursing notes.  Pertinent labs & imaging results that were available during my care of the patient were reviewed by me and considered in my medical decision making (see chart for details).        Patient comes in with chief complaint of cough and shortness of breath.  He is also having fevers.  He has history of hypertension, OA. Patient is already on Levaquin for a pneumonia.  On exam he does not have any focal rhonchi, rales or wheezing.  Chest x-ray is clear.  White count is normal, however he does have lymphopenia.  Based on the data available to [place] from outside countries, lymphopenia is a marker for COVID-19.  I informed patient that although he does not have any high risk exposures to COVID-19, we are suspicious that he might be having it. Patient does not meet [place] criteria for coronavirus testing at this time.  We have informed patient the criteria required for emergency testing of coronavirus.  We also discussed the facts surrounding the prognosis and symptoms of coronavirus known to this point.  Strict ER return precautions have been discussed with the patient and requested return to the emergency room if they start having severe shortness of breath, weakness, fainting, confusion.  Finally we have  recommended that they perform self quarantine for 14 days since the onset of symptoms and at least until 3 days after his symptoms have resolved.  Patient expresses understanding of return precautions and discharge instructions.  PCP follow-up and [facility]-visit options also discussed.   Final Clinical Impressions(s) / ED Diagnoses   Final diagnoses:  SOB (shortness of breath)  Suspected Covid-19 Virus Infection    ED Discharge Orders    None       Varney Biles, MD 07/28/18 [phone]

## 2018-08-01 DIAGNOSIS — Z20818 Contact with and (suspected) exposure to other bacterial communicable diseases: Secondary | ICD-10-CM | POA: Diagnosis not present

## 2018-08-20 DIAGNOSIS — Z20818 Contact with and (suspected) exposure to other bacterial communicable diseases: Secondary | ICD-10-CM | POA: Diagnosis not present

## 2018-09-06 DIAGNOSIS — Z20828 Contact with and (suspected) exposure to other viral communicable diseases: Secondary | ICD-10-CM | POA: Diagnosis not present

## 2018-10-03 DIAGNOSIS — H52202 Unspecified astigmatism, left eye: Secondary | ICD-10-CM | POA: Diagnosis not present

## 2018-10-03 DIAGNOSIS — H5000 Unspecified esotropia: Secondary | ICD-10-CM | POA: Diagnosis not present

## 2018-10-03 DIAGNOSIS — Z961 Presence of intraocular lens: Secondary | ICD-10-CM | POA: Diagnosis not present

## 2018-10-10 DIAGNOSIS — Z125 Encounter for screening for malignant neoplasm of prostate: Secondary | ICD-10-CM | POA: Diagnosis not present

## 2018-10-10 DIAGNOSIS — Z0000 Encounter for general adult medical examination without abnormal findings: Secondary | ICD-10-CM | POA: Diagnosis not present

## 2018-10-10 DIAGNOSIS — Z79899 Other long term (current) drug therapy: Secondary | ICD-10-CM | POA: Diagnosis not present

## 2018-10-10 DIAGNOSIS — E7849 Other hyperlipidemia: Secondary | ICD-10-CM | POA: Diagnosis not present

## 2018-10-17 DIAGNOSIS — Z0000 Encounter for general adult medical examination without abnormal findings: Secondary | ICD-10-CM | POA: Diagnosis not present

## 2018-10-17 DIAGNOSIS — Z1331 Encounter for screening for depression: Secondary | ICD-10-CM | POA: Diagnosis not present

## 2019-02-08 DIAGNOSIS — Z23 Encounter for immunization: Secondary | ICD-10-CM | POA: Diagnosis not present

## 2019-02-13 DIAGNOSIS — Z20828 Contact with and (suspected) exposure to other viral communicable diseases: Secondary | ICD-10-CM | POA: Diagnosis not present

## 2019-04-15 DIAGNOSIS — I788 Other diseases of capillaries: Secondary | ICD-10-CM | POA: Diagnosis not present

## 2019-04-15 DIAGNOSIS — D485 Neoplasm of uncertain behavior of skin: Secondary | ICD-10-CM | POA: Diagnosis not present

## 2019-04-15 DIAGNOSIS — D224 Melanocytic nevi of scalp and neck: Secondary | ICD-10-CM | POA: Diagnosis not present

## 2019-04-15 DIAGNOSIS — D225 Melanocytic nevi of trunk: Secondary | ICD-10-CM | POA: Diagnosis not present

## 2019-04-15 DIAGNOSIS — L821 Other seborrheic keratosis: Secondary | ICD-10-CM | POA: Diagnosis not present

## 2019-04-15 DIAGNOSIS — L814 Other melanin hyperpigmentation: Secondary | ICD-10-CM | POA: Diagnosis not present

## 2019-05-21 DIAGNOSIS — M25561 Pain in right knee: Secondary | ICD-10-CM | POA: Diagnosis not present

## 2019-05-21 DIAGNOSIS — M25562 Pain in left knee: Secondary | ICD-10-CM | POA: Diagnosis not present

## 2019-05-21 DIAGNOSIS — Z96651 Presence of right artificial knee joint: Secondary | ICD-10-CM | POA: Diagnosis not present

## 2019-05-21 DIAGNOSIS — M25462 Effusion, left knee: Secondary | ICD-10-CM | POA: Diagnosis not present

## 2019-05-21 DIAGNOSIS — M25461 Effusion, right knee: Secondary | ICD-10-CM | POA: Diagnosis not present

## 2019-05-21 DIAGNOSIS — Z96641 Presence of right artificial hip joint: Secondary | ICD-10-CM | POA: Diagnosis not present

## 2019-05-29 DIAGNOSIS — H5032 Intermittent alternating esotropia: Secondary | ICD-10-CM | POA: Diagnosis not present

## 2019-07-25 DIAGNOSIS — Z23 Encounter for immunization: Secondary | ICD-10-CM | POA: Diagnosis not present

## 2019-08-05 ENCOUNTER — Ambulatory Visit: Payer: BLUE CROSS/BLUE SHIELD

## 2019-08-21 DIAGNOSIS — Z23 Encounter for immunization: Secondary | ICD-10-CM | POA: Diagnosis not present

## 2019-08-26 DIAGNOSIS — M25511 Pain in right shoulder: Secondary | ICD-10-CM | POA: Diagnosis not present

## 2019-08-26 DIAGNOSIS — M7541 Impingement syndrome of right shoulder: Secondary | ICD-10-CM | POA: Diagnosis not present

## 2019-09-25 DIAGNOSIS — M7541 Impingement syndrome of right shoulder: Secondary | ICD-10-CM | POA: Diagnosis not present

## 2019-09-25 DIAGNOSIS — M25511 Pain in right shoulder: Secondary | ICD-10-CM | POA: Diagnosis not present

## 2019-10-08 DIAGNOSIS — H5000 Unspecified esotropia: Secondary | ICD-10-CM | POA: Diagnosis not present

## 2019-10-08 DIAGNOSIS — H52203 Unspecified astigmatism, bilateral: Secondary | ICD-10-CM | POA: Diagnosis not present

## 2019-10-08 DIAGNOSIS — Z961 Presence of intraocular lens: Secondary | ICD-10-CM | POA: Diagnosis not present

## 2019-10-23 DIAGNOSIS — E785 Hyperlipidemia, unspecified: Secondary | ICD-10-CM | POA: Diagnosis not present

## 2019-10-23 DIAGNOSIS — R82998 Other abnormal findings in urine: Secondary | ICD-10-CM | POA: Diagnosis not present

## 2019-10-23 DIAGNOSIS — Z125 Encounter for screening for malignant neoplasm of prostate: Secondary | ICD-10-CM | POA: Diagnosis not present

## 2019-10-23 DIAGNOSIS — Z0000 Encounter for general adult medical examination without abnormal findings: Secondary | ICD-10-CM | POA: Diagnosis not present

## 2019-10-30 DIAGNOSIS — J309 Allergic rhinitis, unspecified: Secondary | ICD-10-CM | POA: Diagnosis not present

## 2019-10-30 DIAGNOSIS — E785 Hyperlipidemia, unspecified: Secondary | ICD-10-CM | POA: Diagnosis not present

## 2019-10-30 DIAGNOSIS — Z0000 Encounter for general adult medical examination without abnormal findings: Secondary | ICD-10-CM | POA: Diagnosis not present

## 2019-10-30 DIAGNOSIS — R200 Anesthesia of skin: Secondary | ICD-10-CM | POA: Diagnosis not present

## 2019-10-30 DIAGNOSIS — M1990 Unspecified osteoarthritis, unspecified site: Secondary | ICD-10-CM | POA: Diagnosis not present

## 2019-10-30 DIAGNOSIS — Z23 Encounter for immunization: Secondary | ICD-10-CM | POA: Diagnosis not present

## 2019-11-19 DIAGNOSIS — S93492A Sprain of other ligament of left ankle, initial encounter: Secondary | ICD-10-CM | POA: Diagnosis not present

## 2019-11-19 DIAGNOSIS — Z96651 Presence of right artificial knee joint: Secondary | ICD-10-CM | POA: Diagnosis not present

## 2019-12-12 DIAGNOSIS — M25512 Pain in left shoulder: Secondary | ICD-10-CM | POA: Diagnosis not present

## 2019-12-26 DIAGNOSIS — M25512 Pain in left shoulder: Secondary | ICD-10-CM | POA: Diagnosis not present

## 2020-01-09 DIAGNOSIS — M25512 Pain in left shoulder: Secondary | ICD-10-CM | POA: Diagnosis not present

## 2020-01-20 DIAGNOSIS — M25512 Pain in left shoulder: Secondary | ICD-10-CM | POA: Diagnosis not present

## 2020-01-28 DIAGNOSIS — H538 Other visual disturbances: Secondary | ICD-10-CM | POA: Diagnosis not present

## 2020-01-31 ENCOUNTER — Other Ambulatory Visit: Payer: Self-pay | Admitting: Internal Medicine

## 2020-01-31 DIAGNOSIS — E663 Overweight: Secondary | ICD-10-CM

## 2020-01-31 DIAGNOSIS — M25512 Pain in left shoulder: Secondary | ICD-10-CM | POA: Diagnosis not present

## 2020-01-31 DIAGNOSIS — R0600 Dyspnea, unspecified: Secondary | ICD-10-CM

## 2020-01-31 DIAGNOSIS — R0602 Shortness of breath: Secondary | ICD-10-CM

## 2020-01-31 DIAGNOSIS — R0609 Other forms of dyspnea: Secondary | ICD-10-CM

## 2020-02-03 ENCOUNTER — Other Ambulatory Visit: Payer: Self-pay

## 2020-02-03 ENCOUNTER — Ambulatory Visit: Payer: BC Managed Care – PPO

## 2020-02-03 DIAGNOSIS — R0602 Shortness of breath: Secondary | ICD-10-CM

## 2020-02-03 DIAGNOSIS — R0609 Other forms of dyspnea: Secondary | ICD-10-CM | POA: Diagnosis not present

## 2020-02-03 DIAGNOSIS — E663 Overweight: Secondary | ICD-10-CM | POA: Diagnosis not present

## 2020-02-03 DIAGNOSIS — R0600 Dyspnea, unspecified: Secondary | ICD-10-CM

## 2020-02-05 ENCOUNTER — Other Ambulatory Visit: Payer: Self-pay | Admitting: Internal Medicine

## 2020-02-05 DIAGNOSIS — R0600 Dyspnea, unspecified: Secondary | ICD-10-CM

## 2020-02-05 DIAGNOSIS — R0609 Other forms of dyspnea: Secondary | ICD-10-CM

## 2020-02-05 DIAGNOSIS — R0602 Shortness of breath: Secondary | ICD-10-CM

## 2020-02-05 DIAGNOSIS — R9439 Abnormal result of other cardiovascular function study: Secondary | ICD-10-CM

## 2020-02-06 DIAGNOSIS — M25512 Pain in left shoulder: Secondary | ICD-10-CM | POA: Diagnosis not present

## 2020-02-11 ENCOUNTER — Other Ambulatory Visit: Payer: BC Managed Care – PPO

## 2020-02-11 ENCOUNTER — Other Ambulatory Visit: Payer: Self-pay

## 2020-02-11 ENCOUNTER — Ambulatory Visit: Payer: BC Managed Care – PPO

## 2020-02-11 DIAGNOSIS — R0609 Other forms of dyspnea: Secondary | ICD-10-CM

## 2020-02-11 DIAGNOSIS — R0602 Shortness of breath: Secondary | ICD-10-CM | POA: Diagnosis not present

## 2020-02-11 DIAGNOSIS — R9439 Abnormal result of other cardiovascular function study: Secondary | ICD-10-CM

## 2020-02-11 DIAGNOSIS — R0600 Dyspnea, unspecified: Secondary | ICD-10-CM

## 2020-02-19 DIAGNOSIS — H26493 Other secondary cataract, bilateral: Secondary | ICD-10-CM | POA: Diagnosis not present

## 2020-03-05 DIAGNOSIS — H26491 Other secondary cataract, right eye: Secondary | ICD-10-CM | POA: Diagnosis not present

## 2020-03-13 IMAGING — CR CHEST - 2 VIEW
2 series · 2 of 2 positions shown · non-contrast
Comparison: 07/08/2013

CLINICAL DATA: Cough and fever. Shortness of breath.

EXAM:
CHEST - 2 VIEW

[w chest pa]
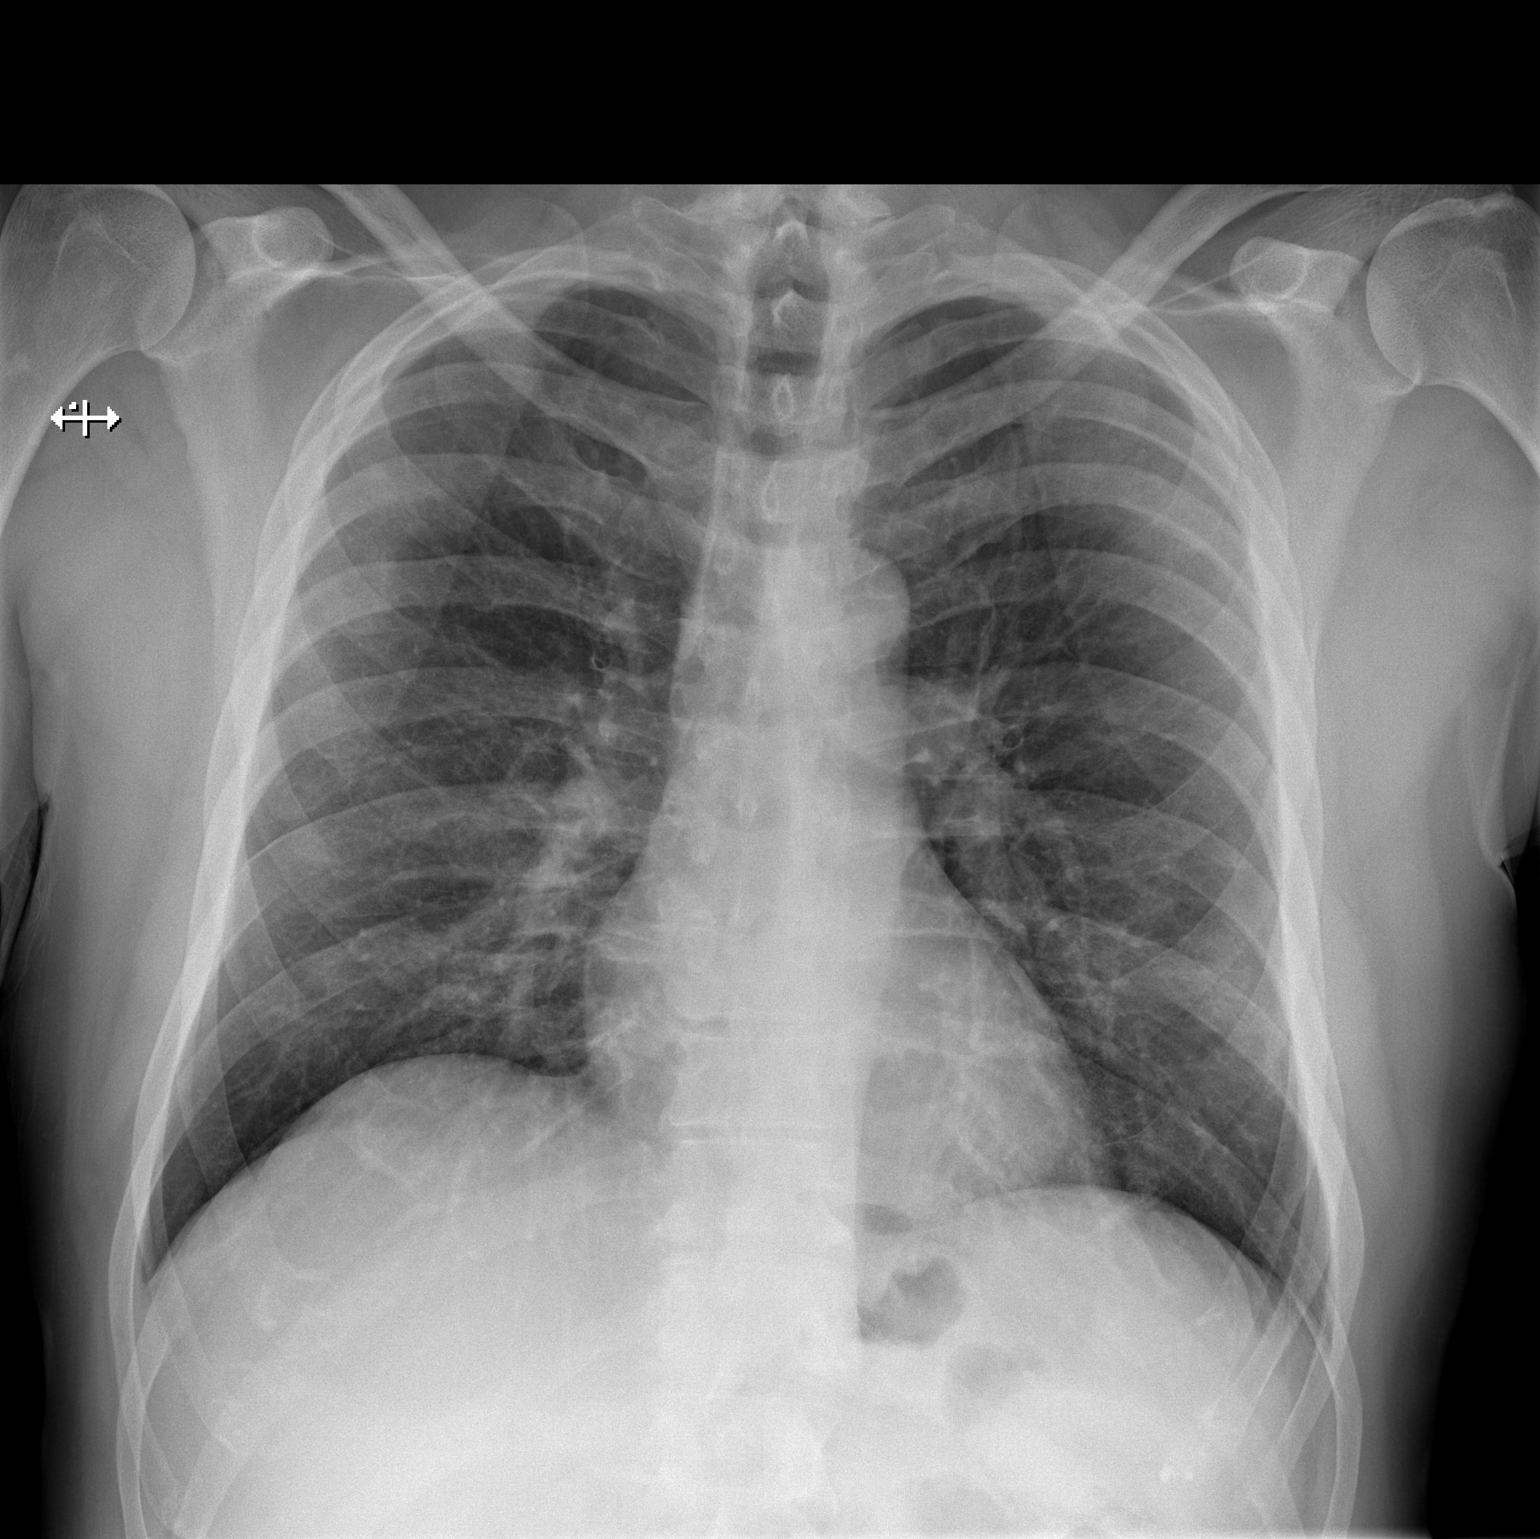

[w chest lat]
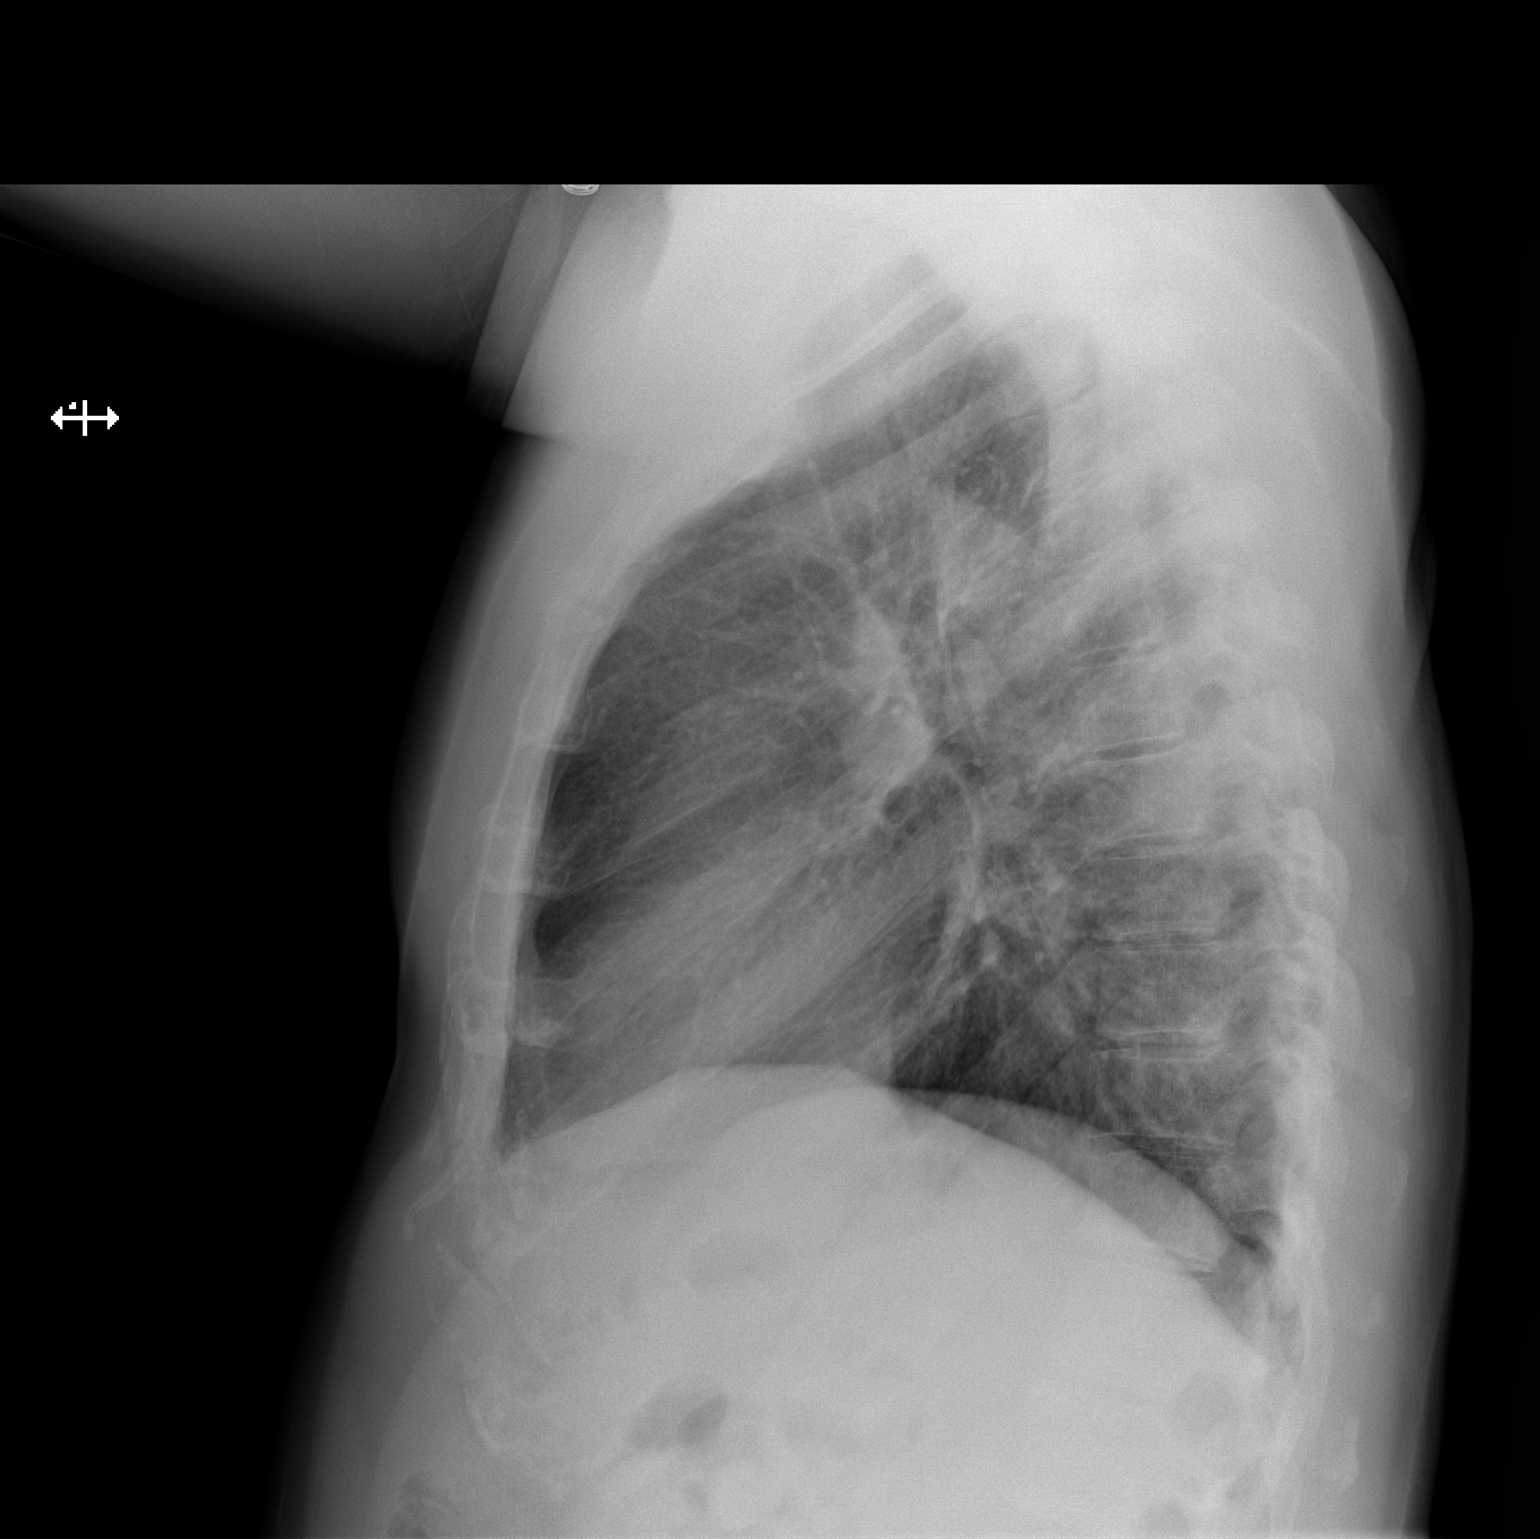

[2 of 2 positions shown; findings below may reference images not displayed]

FINDINGS: The cardiomediastinal contours are normal. The lungs are clear.
Pulmonary vasculature is normal. No consolidation, pleural effusion,
or pneumothorax. No acute osseous abnormalities are seen.
IMPRESSION: No acute chest findings.

## 2020-03-19 DIAGNOSIS — H26492 Other secondary cataract, left eye: Secondary | ICD-10-CM | POA: Diagnosis not present

## 2020-03-30 DIAGNOSIS — Z23 Encounter for immunization: Secondary | ICD-10-CM | POA: Diagnosis not present

## 2020-05-11 DIAGNOSIS — L814 Other melanin hyperpigmentation: Secondary | ICD-10-CM | POA: Diagnosis not present

## 2020-05-11 DIAGNOSIS — D225 Melanocytic nevi of trunk: Secondary | ICD-10-CM | POA: Diagnosis not present

## 2020-05-11 DIAGNOSIS — L821 Other seborrheic keratosis: Secondary | ICD-10-CM | POA: Diagnosis not present

## 2020-05-13 DIAGNOSIS — Z20822 Contact with and (suspected) exposure to covid-19: Secondary | ICD-10-CM | POA: Diagnosis not present

## 2020-05-13 DIAGNOSIS — Z20828 Contact with and (suspected) exposure to other viral communicable diseases: Secondary | ICD-10-CM | POA: Diagnosis not present

## 2020-06-18 DIAGNOSIS — N5201 Erectile dysfunction due to arterial insufficiency: Secondary | ICD-10-CM | POA: Diagnosis not present

## 2020-06-18 DIAGNOSIS — N5311 Retarded ejaculation: Secondary | ICD-10-CM | POA: Diagnosis not present

## 2020-08-05 DIAGNOSIS — H5000 Unspecified esotropia: Secondary | ICD-10-CM | POA: Diagnosis not present

## 2020-08-05 DIAGNOSIS — Z973 Presence of spectacles and contact lenses: Secondary | ICD-10-CM | POA: Diagnosis not present

## 2020-11-10 DIAGNOSIS — E785 Hyperlipidemia, unspecified: Secondary | ICD-10-CM | POA: Diagnosis not present

## 2020-11-10 DIAGNOSIS — Z125 Encounter for screening for malignant neoplasm of prostate: Secondary | ICD-10-CM | POA: Diagnosis not present

## 2020-11-17 DIAGNOSIS — Z471 Aftercare following joint replacement surgery: Secondary | ICD-10-CM | POA: Diagnosis not present

## 2020-11-17 DIAGNOSIS — M1711 Unilateral primary osteoarthritis, right knee: Secondary | ICD-10-CM | POA: Diagnosis not present

## 2020-11-17 DIAGNOSIS — Z96651 Presence of right artificial knee joint: Secondary | ICD-10-CM | POA: Diagnosis not present

## 2020-11-17 DIAGNOSIS — M1612 Unilateral primary osteoarthritis, left hip: Secondary | ICD-10-CM | POA: Diagnosis not present

## 2020-11-17 DIAGNOSIS — Z96641 Presence of right artificial hip joint: Secondary | ICD-10-CM | POA: Diagnosis not present

## 2020-11-18 DIAGNOSIS — Z1331 Encounter for screening for depression: Secondary | ICD-10-CM | POA: Diagnosis not present

## 2020-11-18 DIAGNOSIS — R82998 Other abnormal findings in urine: Secondary | ICD-10-CM | POA: Diagnosis not present

## 2020-11-18 DIAGNOSIS — Z0000 Encounter for general adult medical examination without abnormal findings: Secondary | ICD-10-CM | POA: Diagnosis not present

## 2020-11-18 DIAGNOSIS — E785 Hyperlipidemia, unspecified: Secondary | ICD-10-CM | POA: Diagnosis not present

## 2020-11-18 DIAGNOSIS — Z1339 Encounter for screening examination for other mental health and behavioral disorders: Secondary | ICD-10-CM | POA: Diagnosis not present

## 2020-12-07 DIAGNOSIS — H50011 Monocular esotropia, right eye: Secondary | ICD-10-CM | POA: Diagnosis not present

## 2020-12-07 DIAGNOSIS — H518 Other specified disorders of binocular movement: Secondary | ICD-10-CM | POA: Diagnosis not present

## 2020-12-07 DIAGNOSIS — H532 Diplopia: Secondary | ICD-10-CM | POA: Diagnosis not present

## 2020-12-07 DIAGNOSIS — H5213 Myopia, bilateral: Secondary | ICD-10-CM | POA: Diagnosis not present

## 2021-02-22 DIAGNOSIS — H5000 Unspecified esotropia: Secondary | ICD-10-CM | POA: Diagnosis not present

## 2021-02-22 DIAGNOSIS — H52203 Unspecified astigmatism, bilateral: Secondary | ICD-10-CM | POA: Diagnosis not present

## 2021-02-22 DIAGNOSIS — Z961 Presence of intraocular lens: Secondary | ICD-10-CM | POA: Diagnosis not present

## 2021-06-14 DIAGNOSIS — H50011 Monocular esotropia, right eye: Secondary | ICD-10-CM | POA: Diagnosis not present

## 2021-06-14 DIAGNOSIS — H532 Diplopia: Secondary | ICD-10-CM | POA: Diagnosis not present

## 2021-06-14 DIAGNOSIS — H5213 Myopia, bilateral: Secondary | ICD-10-CM | POA: Diagnosis not present

## 2021-06-14 DIAGNOSIS — H518 Other specified disorders of binocular movement: Secondary | ICD-10-CM | POA: Diagnosis not present

## 2021-07-07 DIAGNOSIS — H5000 Unspecified esotropia: Secondary | ICD-10-CM | POA: Diagnosis not present

## 2021-07-07 DIAGNOSIS — H4423 Degenerative myopia, bilateral: Secondary | ICD-10-CM | POA: Diagnosis not present

## 2021-07-07 DIAGNOSIS — M1711 Unilateral primary osteoarthritis, right knee: Secondary | ICD-10-CM | POA: Diagnosis not present

## 2021-07-07 DIAGNOSIS — Z79899 Other long term (current) drug therapy: Secondary | ICD-10-CM | POA: Diagnosis not present

## 2021-07-07 DIAGNOSIS — H50011 Monocular esotropia, right eye: Secondary | ICD-10-CM | POA: Diagnosis not present

## 2021-07-07 DIAGNOSIS — H518 Other specified disorders of binocular movement: Secondary | ICD-10-CM | POA: Diagnosis not present

## 2021-07-07 DIAGNOSIS — R350 Frequency of micturition: Secondary | ICD-10-CM | POA: Diagnosis not present

## 2021-07-07 DIAGNOSIS — H532 Diplopia: Secondary | ICD-10-CM | POA: Diagnosis not present

## 2021-07-07 DIAGNOSIS — Z7982 Long term (current) use of aspirin: Secondary | ICD-10-CM | POA: Diagnosis not present

## 2021-07-07 DIAGNOSIS — Z9889 Other specified postprocedural states: Secondary | ICD-10-CM | POA: Diagnosis not present

## 2021-09-24 DIAGNOSIS — R531 Weakness: Secondary | ICD-10-CM | POA: Diagnosis not present

## 2021-09-24 DIAGNOSIS — M25561 Pain in right knee: Secondary | ICD-10-CM | POA: Diagnosis not present

## 2021-11-04 DIAGNOSIS — M25561 Pain in right knee: Secondary | ICD-10-CM | POA: Diagnosis not present

## 2021-11-04 DIAGNOSIS — M1712 Unilateral primary osteoarthritis, left knee: Secondary | ICD-10-CM | POA: Diagnosis not present

## 2021-11-04 DIAGNOSIS — M25562 Pain in left knee: Secondary | ICD-10-CM | POA: Diagnosis not present

## 2021-11-04 DIAGNOSIS — Z96641 Presence of right artificial hip joint: Secondary | ICD-10-CM | POA: Diagnosis not present

## 2021-11-04 DIAGNOSIS — Z96651 Presence of right artificial knee joint: Secondary | ICD-10-CM | POA: Diagnosis not present

## 2021-11-04 DIAGNOSIS — M25551 Pain in right hip: Secondary | ICD-10-CM | POA: Diagnosis not present

## 2021-11-24 DIAGNOSIS — Z125 Encounter for screening for malignant neoplasm of prostate: Secondary | ICD-10-CM | POA: Diagnosis not present

## 2021-11-24 DIAGNOSIS — E785 Hyperlipidemia, unspecified: Secondary | ICD-10-CM | POA: Diagnosis not present

## 2021-12-01 DIAGNOSIS — Z1331 Encounter for screening for depression: Secondary | ICD-10-CM | POA: Diagnosis not present

## 2021-12-01 DIAGNOSIS — Z0000 Encounter for general adult medical examination without abnormal findings: Secondary | ICD-10-CM | POA: Diagnosis not present

## 2021-12-01 DIAGNOSIS — Z1339 Encounter for screening examination for other mental health and behavioral disorders: Secondary | ICD-10-CM | POA: Diagnosis not present

## 2021-12-01 DIAGNOSIS — E785 Hyperlipidemia, unspecified: Secondary | ICD-10-CM | POA: Diagnosis not present

## 2021-12-01 DIAGNOSIS — R82998 Other abnormal findings in urine: Secondary | ICD-10-CM | POA: Diagnosis not present

## 2022-02-28 DIAGNOSIS — H52203 Unspecified astigmatism, bilateral: Secondary | ICD-10-CM | POA: Diagnosis not present

## 2022-02-28 DIAGNOSIS — Z961 Presence of intraocular lens: Secondary | ICD-10-CM | POA: Diagnosis not present

## 2022-03-07 DIAGNOSIS — Z23 Encounter for immunization: Secondary | ICD-10-CM | POA: Diagnosis not present

## 2022-03-15 DIAGNOSIS — M25562 Pain in left knee: Secondary | ICD-10-CM | POA: Diagnosis not present

## 2022-03-15 DIAGNOSIS — M25561 Pain in right knee: Secondary | ICD-10-CM | POA: Diagnosis not present

## 2022-04-05 DIAGNOSIS — D1801 Hemangioma of skin and subcutaneous tissue: Secondary | ICD-10-CM | POA: Diagnosis not present

## 2022-04-05 DIAGNOSIS — L821 Other seborrheic keratosis: Secondary | ICD-10-CM | POA: Diagnosis not present

## 2022-04-05 DIAGNOSIS — L738 Other specified follicular disorders: Secondary | ICD-10-CM | POA: Diagnosis not present

## 2022-04-05 DIAGNOSIS — D224 Melanocytic nevi of scalp and neck: Secondary | ICD-10-CM | POA: Diagnosis not present

## 2022-04-05 DIAGNOSIS — D225 Melanocytic nevi of trunk: Secondary | ICD-10-CM | POA: Diagnosis not present

## 2022-05-03 DIAGNOSIS — M1712 Unilateral primary osteoarthritis, left knee: Secondary | ICD-10-CM | POA: Diagnosis not present

## 2022-05-03 DIAGNOSIS — M25561 Pain in right knee: Secondary | ICD-10-CM | POA: Diagnosis not present

## 2022-06-06 DIAGNOSIS — M170 Bilateral primary osteoarthritis of knee: Secondary | ICD-10-CM | POA: Diagnosis not present

## 2022-06-22 DIAGNOSIS — K4090 Unilateral inguinal hernia, without obstruction or gangrene, not specified as recurrent: Secondary | ICD-10-CM | POA: Diagnosis not present

## 2022-07-05 DIAGNOSIS — M1712 Unilateral primary osteoarthritis, left knee: Secondary | ICD-10-CM | POA: Diagnosis not present

## 2022-07-12 DIAGNOSIS — M1712 Unilateral primary osteoarthritis, left knee: Secondary | ICD-10-CM | POA: Diagnosis not present

## 2022-07-13 DIAGNOSIS — D176 Benign lipomatous neoplasm of spermatic cord: Secondary | ICD-10-CM | POA: Diagnosis not present

## 2022-07-13 DIAGNOSIS — K4090 Unilateral inguinal hernia, without obstruction or gangrene, not specified as recurrent: Secondary | ICD-10-CM | POA: Diagnosis not present

## 2022-07-19 DIAGNOSIS — M1712 Unilateral primary osteoarthritis, left knee: Secondary | ICD-10-CM | POA: Diagnosis not present

## 2022-08-23 DIAGNOSIS — M79672 Pain in left foot: Secondary | ICD-10-CM | POA: Diagnosis not present

## 2022-08-30 DIAGNOSIS — M1712 Unilateral primary osteoarthritis, left knee: Secondary | ICD-10-CM | POA: Diagnosis not present

## 2022-09-12 DIAGNOSIS — M25562 Pain in left knee: Secondary | ICD-10-CM | POA: Diagnosis not present

## 2022-09-19 DIAGNOSIS — S83242A Other tear of medial meniscus, current injury, left knee, initial encounter: Secondary | ICD-10-CM | POA: Diagnosis not present

## 2022-09-19 DIAGNOSIS — M1712 Unilateral primary osteoarthritis, left knee: Secondary | ICD-10-CM | POA: Diagnosis not present

## 2022-10-11 DIAGNOSIS — Z96651 Presence of right artificial knee joint: Secondary | ICD-10-CM | POA: Diagnosis not present

## 2022-11-07 DIAGNOSIS — M25521 Pain in right elbow: Secondary | ICD-10-CM | POA: Diagnosis not present

## 2022-11-09 ENCOUNTER — Ambulatory Visit: Payer: Self-pay | Admitting: Student

## 2022-11-24 ENCOUNTER — Ambulatory Visit: Admit: 2022-11-24 | Payer: Self-pay | Admitting: Orthopedic Surgery

## 2022-11-24 SURGERY — ARTHROPLASTY, KNEE, TOTAL, USING IMAGELESS COMPUTER-ASSISTED NAVIGATION
Anesthesia: Spinal | Site: Knee | Laterality: Left

## 2022-11-30 DIAGNOSIS — Z1212 Encounter for screening for malignant neoplasm of rectum: Secondary | ICD-10-CM | POA: Diagnosis not present

## 2022-11-30 DIAGNOSIS — Z125 Encounter for screening for malignant neoplasm of prostate: Secondary | ICD-10-CM | POA: Diagnosis not present

## 2022-11-30 DIAGNOSIS — E785 Hyperlipidemia, unspecified: Secondary | ICD-10-CM | POA: Diagnosis not present

## 2022-11-30 DIAGNOSIS — Z20828 Contact with and (suspected) exposure to other viral communicable diseases: Secondary | ICD-10-CM | POA: Diagnosis not present

## 2022-12-07 DIAGNOSIS — R82998 Other abnormal findings in urine: Secondary | ICD-10-CM | POA: Diagnosis not present

## 2022-12-07 DIAGNOSIS — M1990 Unspecified osteoarthritis, unspecified site: Secondary | ICD-10-CM | POA: Diagnosis not present

## 2022-12-07 DIAGNOSIS — Z0000 Encounter for general adult medical examination without abnormal findings: Secondary | ICD-10-CM | POA: Diagnosis not present

## 2022-12-07 DIAGNOSIS — Z1339 Encounter for screening examination for other mental health and behavioral disorders: Secondary | ICD-10-CM | POA: Diagnosis not present

## 2022-12-07 DIAGNOSIS — Z1331 Encounter for screening for depression: Secondary | ICD-10-CM | POA: Diagnosis not present

## 2023-01-09 DIAGNOSIS — G8929 Other chronic pain: Secondary | ICD-10-CM | POA: Diagnosis not present

## 2023-01-09 DIAGNOSIS — M25561 Pain in right knee: Secondary | ICD-10-CM | POA: Diagnosis not present

## 2023-01-09 DIAGNOSIS — M1712 Unilateral primary osteoarthritis, left knee: Secondary | ICD-10-CM | POA: Diagnosis not present

## 2023-03-01 DIAGNOSIS — E785 Hyperlipidemia, unspecified: Secondary | ICD-10-CM | POA: Diagnosis not present

## 2023-03-01 DIAGNOSIS — M1712 Unilateral primary osteoarthritis, left knee: Secondary | ICD-10-CM | POA: Diagnosis not present

## 2023-03-02 DIAGNOSIS — H33301 Unspecified retinal break, right eye: Secondary | ICD-10-CM | POA: Diagnosis not present

## 2023-03-02 DIAGNOSIS — H52203 Unspecified astigmatism, bilateral: Secondary | ICD-10-CM | POA: Diagnosis not present

## 2023-03-02 DIAGNOSIS — Z961 Presence of intraocular lens: Secondary | ICD-10-CM | POA: Diagnosis not present

## 2023-03-09 ENCOUNTER — Ambulatory Visit: Admit: 2023-03-09 | Payer: Self-pay | Admitting: Orthopedic Surgery

## 2023-03-09 SURGERY — ARTHROPLASTY, KNEE, TOTAL, USING IMAGELESS COMPUTER-ASSISTED NAVIGATION
Anesthesia: Spinal | Site: Knee | Laterality: Left

## 2023-04-07 DIAGNOSIS — Z471 Aftercare following joint replacement surgery: Secondary | ICD-10-CM | POA: Diagnosis not present

## 2023-04-07 DIAGNOSIS — M1712 Unilateral primary osteoarthritis, left knee: Secondary | ICD-10-CM | POA: Diagnosis not present

## 2023-04-07 DIAGNOSIS — G8918 Other acute postprocedural pain: Secondary | ICD-10-CM | POA: Diagnosis not present

## 2023-04-07 DIAGNOSIS — M25762 Osteophyte, left knee: Secondary | ICD-10-CM | POA: Diagnosis not present

## 2023-04-07 DIAGNOSIS — Z96652 Presence of left artificial knee joint: Secondary | ICD-10-CM | POA: Diagnosis not present

## 2023-04-08 DIAGNOSIS — M1712 Unilateral primary osteoarthritis, left knee: Secondary | ICD-10-CM | POA: Diagnosis not present

## 2023-04-11 DIAGNOSIS — R269 Unspecified abnormalities of gait and mobility: Secondary | ICD-10-CM | POA: Diagnosis not present

## 2023-04-11 DIAGNOSIS — M25562 Pain in left knee: Secondary | ICD-10-CM | POA: Diagnosis not present

## 2023-04-13 DIAGNOSIS — R269 Unspecified abnormalities of gait and mobility: Secondary | ICD-10-CM | POA: Diagnosis not present

## 2023-04-13 DIAGNOSIS — M25562 Pain in left knee: Secondary | ICD-10-CM | POA: Diagnosis not present

## 2023-04-18 DIAGNOSIS — R269 Unspecified abnormalities of gait and mobility: Secondary | ICD-10-CM | POA: Diagnosis not present

## 2023-04-18 DIAGNOSIS — M25562 Pain in left knee: Secondary | ICD-10-CM | POA: Diagnosis not present

## 2023-04-20 DIAGNOSIS — M25562 Pain in left knee: Secondary | ICD-10-CM | POA: Diagnosis not present

## 2023-04-20 DIAGNOSIS — R269 Unspecified abnormalities of gait and mobility: Secondary | ICD-10-CM | POA: Diagnosis not present

## 2023-04-21 DIAGNOSIS — Z96652 Presence of left artificial knee joint: Secondary | ICD-10-CM | POA: Diagnosis not present

## 2023-04-27 DIAGNOSIS — M25562 Pain in left knee: Secondary | ICD-10-CM | POA: Diagnosis not present

## 2023-04-27 DIAGNOSIS — R269 Unspecified abnormalities of gait and mobility: Secondary | ICD-10-CM | POA: Diagnosis not present

## 2023-05-02 DIAGNOSIS — R269 Unspecified abnormalities of gait and mobility: Secondary | ICD-10-CM | POA: Diagnosis not present

## 2023-05-02 DIAGNOSIS — M25562 Pain in left knee: Secondary | ICD-10-CM | POA: Diagnosis not present

## 2023-05-04 DIAGNOSIS — M25562 Pain in left knee: Secondary | ICD-10-CM | POA: Diagnosis not present

## 2023-05-04 DIAGNOSIS — R269 Unspecified abnormalities of gait and mobility: Secondary | ICD-10-CM | POA: Diagnosis not present

## 2023-05-10 ENCOUNTER — Ambulatory Visit: Payer: BC Managed Care – PPO | Admitting: Psychology

## 2023-05-10 DIAGNOSIS — F411 Generalized anxiety disorder: Secondary | ICD-10-CM | POA: Diagnosis not present

## 2023-05-10 NOTE — Progress Notes (Addendum)
 [facility] Counselor Initial Adult Exam  Name: Kristopher Holden Date: 05/10/2023 MRN: [id number] DOB: Mar 19, 1956 PCP: Kristopher Ade, MD  Time spent: 60 minutes  Guardian/Payee:  self    Paperwork requested: No   Reason for Visit /Presenting Problem: Parenting concerns  Mental Status Exam: Appearance:   Casual     Behavior:  Appropriate  Motor:  Normal  Speech/Language:   Normal Rate  Affect:  Appropriate  Mood:  normal  Thought process:  normal  Thought content:    WNL  Sensory/Perceptual disturbances:    WNL  Orientation:  oriented to person, place, and situation  Attention:  Good  Concentration:  Good  Memory:  WNL  Fund of knowledge:   Good  Insight:    Good  Judgment:   Good  Impulse Control:  Good    Reported Symptoms:  worry, anxiety  Risk Assessment: Danger to Self:  No Self-injurious Behavior: No Danger to Others: No Duty to Warn:no Physical Aggression / Violence:No  Access to Firearms a concern:  unknown Gang Involvement:No  Patient / guardian was educated about steps to take if suicide or homicide risk level increases between visits: n/a While future psychiatric events cannot be accurately predicted, the patient does not currently require acute inpatient psychiatric care and does not currently meet [place]  involuntary commitment criteria.  Substance Abuse History: Current substance abuse: No     Past Psychiatric History:   No previous psychological problems have been observed Outpatient Providers:Kristopher Holden, Ph.D. History of Psych Hospitalization: No  Psychological Testing:  N/A    Abuse History:  Victim of: No.,  N/A    Report needed: No. Victim of Neglect:No. Perpetrator of  N/A   Witness / Exposure to Domestic Violence: No   Protective Services Involvement: No  Witness to [employer] Violence:  No   Family History:  Family History  Problem Relation Age of Onset   Lung cancer Mother    Hyperlipidemia Mother     Diabetes Maternal Aunt    Heart attack Father    Healthy Brother    Healthy Son    Hypertension Neg Hx     Living situation: the patient lives with their spouse  Sexual Orientation: Straight  Relationship Status: married  Name of spouse / other:unknown If a parent, number of children / ages:Twin boys aged 58  Support Systems: N/A  Financial Stress:  No   Income/Employment/Disability: Employment  Financial Planner: No   Educational History: Education: post engineer, maintenance (it) work or Engineer, Technical Sales: unknown  Any cultural differences that may affect / interfere with treatment:  not applicable   Recreation/Hobbies: unknown  Stressors: Marital or family conflict    Strengths: Supportive Relationships  Barriers:  unknown   Legal History: Pending legal issue / charges: The patient has no significant history of legal issues. History of legal issue / charges:  N/A  Medical History/Surgical History: reviewed Past Medical History:  Diagnosis Date   Complication of anesthesia    per pt, sensitive to sedation/ B/P drops!   Hyperlipidemia    Osteoarthritis    knee and hip   PONV (postoperative nausea and vomiting)    lowers BP and severe nausea    Past Surgical History:  Procedure Laterality Date   COLONOSCOPY     EYE SURGERY     KNEE SURGERY  1991   partial acl tear reconstruction/ left knee   STRABISMUS SURGERY Bilateral 10/06/2017   Procedure: REPAIR STRABISMUS;  Surgeon: Neysa Fallow, MD;  Location: [facility];  Service: Ophthalmology;  Laterality: Bilateral;   total hip resurface   2009   right    Medications: Current Outpatient Medications  Medication Sig Dispense Refill   acetaminophen  (TYLENOL ) 325 MG tablet Take 650 mg by mouth every 6 (six) hours as needed for moderate pain.     atorvastatin (LIPITOR) 20 MG tablet TAKE 1 TABLET ONCE DAILY. (Patient taking differently: Take 20 mg by mouth daily at 6 PM. ) 90 tablet  1   budesonide -formoterol (SYMBICORT) 160-4.5 MCG/ACT inhaler Inhale 2 puffs into the lungs 2 (two) times daily.      cetirizine (ZYRTEC) 10 MG tablet Take 10 mg by mouth daily.     fish oil-omega-3 fatty acids 1000 MG capsule Take 2 g by mouth 2 (two) times daily. Take 2 bid      fluticasone (FLONASE) 50 MCG/ACT nasal spray Place 1 spray into both nostrils daily.     ibuprofen (ADVIL,MOTRIN) 200 MG tablet Take 400 mg by mouth every 6 (six) hours as needed for moderate pain.     levofloxacin (LEVAQUIN) 500 MG tablet Take 500 mg by mouth daily.      sildenafil (VIAGRA) 100 MG tablet Take 100 mg by mouth. Take 1/4 pills occasionally     No current facility-administered medications for this visit.    Allergies  Allergen Reactions   Penicillins     REACTION: ? Childhood. Did it involve swelling of the face/tongue/throat, SOB, or low BP? Unknown Did it involve sudden or severe rash/hives, skin peeling, or any reaction on the inside of your mouth or nose? No Did you need to seek medical attention at a hospital or doctor's office? Unknown When did it last happen?       If all above answers are "NO", may proceed with cephalosporin use.   Initial session: Kristopher Holden says his twins )Kristopher Holden and Kristopher Holden) are 25 and both graduated college with jobs. He says they both were fine until 8th grade, but then things went south with them. They both were identified as being on the spectrum. Says they both did not have friends in college. Kristopher Holden spent time in [place] before coming back to [place]  and joining another company. Kristopher Holden says that his interactions are limited and he is socially awkward. The brothers travel together every year. Kristopher Holden is extremely detached and will speak only if spoken to. The boys are very smart, but on the spectrum. Expresses considerable worry/concern about how to address concerns and intervene in a way to encourage treatment/support for his boys.     Treatment Plan/goals Patient will  participate in outpatient psychotherapy with the goal of establishing a plan to provide sons with help managing their interpersonal struggles. Will utilize family systems strategies to foster better communication. Will also work to establish more realistic expectations and help him manage his own anxiety associated with his concerns. Therapy will be on an as needed basis. Goal date is 12-25.       Diagnoses:  Generalized Anxiety Disorder  Plan of Care: outpatient psychotherapy   CONI ALM KERNS, PhD

## 2023-05-11 DIAGNOSIS — R269 Unspecified abnormalities of gait and mobility: Secondary | ICD-10-CM | POA: Diagnosis not present

## 2023-05-11 DIAGNOSIS — M25562 Pain in left knee: Secondary | ICD-10-CM | POA: Diagnosis not present

## 2023-05-16 DIAGNOSIS — R269 Unspecified abnormalities of gait and mobility: Secondary | ICD-10-CM | POA: Diagnosis not present

## 2023-05-16 DIAGNOSIS — M25562 Pain in left knee: Secondary | ICD-10-CM | POA: Diagnosis not present

## 2023-05-23 DIAGNOSIS — M25562 Pain in left knee: Secondary | ICD-10-CM | POA: Diagnosis not present

## 2023-05-23 DIAGNOSIS — R269 Unspecified abnormalities of gait and mobility: Secondary | ICD-10-CM | POA: Diagnosis not present

## 2023-05-29 DIAGNOSIS — M170 Bilateral primary osteoarthritis of knee: Secondary | ICD-10-CM | POA: Diagnosis not present

## 2023-05-29 DIAGNOSIS — M1711 Unilateral primary osteoarthritis, right knee: Secondary | ICD-10-CM | POA: Diagnosis not present

## 2023-05-29 DIAGNOSIS — Z96651 Presence of right artificial knee joint: Secondary | ICD-10-CM | POA: Diagnosis not present

## 2023-05-29 DIAGNOSIS — Z96653 Presence of artificial knee joint, bilateral: Secondary | ICD-10-CM | POA: Diagnosis not present

## 2023-05-29 DIAGNOSIS — Z471 Aftercare following joint replacement surgery: Secondary | ICD-10-CM | POA: Diagnosis not present

## 2023-05-29 DIAGNOSIS — Z96641 Presence of right artificial hip joint: Secondary | ICD-10-CM | POA: Diagnosis not present

## 2023-05-29 DIAGNOSIS — Z96652 Presence of left artificial knee joint: Secondary | ICD-10-CM | POA: Diagnosis not present

## 2023-05-30 DIAGNOSIS — M25562 Pain in left knee: Secondary | ICD-10-CM | POA: Diagnosis not present

## 2023-05-30 DIAGNOSIS — R269 Unspecified abnormalities of gait and mobility: Secondary | ICD-10-CM | POA: Diagnosis not present

## 2023-06-07 ENCOUNTER — Ambulatory Visit (INDEPENDENT_AMBULATORY_CARE_PROVIDER_SITE_OTHER): Payer: BC Managed Care – PPO | Admitting: Psychology

## 2023-06-07 DIAGNOSIS — F411 Generalized anxiety disorder: Secondary | ICD-10-CM | POA: Diagnosis not present

## 2023-06-07 NOTE — Progress Notes (Signed)
 [facility] Counselor Initial Adult Exam  Name: Kristopher Holden Date: 06/07/2023 MRN: [id number] DOB: Jun 04, 1955 PCP: Kristopher Ade, MD  Time spent: 60 minutes  Guardian/Payee:  self    Paperwork requested: No   Reason for Visit /Presenting Problem: Parenting concerns  Mental Status Exam: Appearance:   Casual     Behavior:  Appropriate  Motor:  Normal  Speech/Language:   Normal Rate  Affect:  Appropriate  Mood:  normal  Thought process:  normal  Thought content:    WNL  Sensory/Perceptual disturbances:    WNL  Orientation:  oriented to person, place, and situation  Attention:  Good  Concentration:  Good  Memory:  WNL  Fund of knowledge:   Good  Insight:    Good  Judgment:   Good  Impulse Control:  Good    Reported Symptoms:  worry, anxiety  Risk Assessment: Danger to Self:  No Self-injurious Behavior: No Danger to Others: No Duty to Warn:no Physical Aggression / Violence:No  Access to Firearms a concern:  unknown Gang Involvement:No  Patient / guardian was educated about steps to take if suicide or homicide risk level increases between visits: n/a While future psychiatric events cannot be accurately predicted, the patient does not currently require acute inpatient psychiatric care and does not currently meet [facility]  involuntary commitment criteria.  Substance Abuse History: Current substance abuse: No     Past Psychiatric History:   No previous psychological problems have been observed Outpatient Providers:Kristopher Holden, Ph.D. History of Psych Hospitalization: No  Psychological Testing:  N/A    Abuse History:  Victim of: No.,  N/A    Report needed: No. Victim of Neglect:No. Perpetrator of  N/A   Witness / Exposure to Domestic Violence: No   Protective Services Involvement: No  Witness to [employer] Violence:  No   Family History:  Family History  Problem Relation Age of Onset   Lung cancer Mother     Hyperlipidemia Mother    Diabetes Maternal Aunt    Heart attack Father    Healthy Brother    Healthy Son    Hypertension Neg Hx     Living situation: the patient lives with their spouse  Sexual Orientation: Straight  Relationship Status: married  Name of spouse / other:unknown If a parent, number of children / ages:Twin boys aged 69  Support Systems: N/A  Financial Stress:  No   Income/Employment/Disability: Employment  Financial Planner: No   Educational History: Education: post engineer, maintenance (it) work or Engineer, Technical Sales: unknown  Any cultural differences that may affect / interfere with treatment:  not applicable   Recreation/Hobbies: unknown  Stressors: Marital or family conflict    Strengths: Supportive Relationships  Barriers:  unknown   Legal History: Pending legal issue / charges: The patient has no significant history of legal issues. History of legal issue / charges:  N/A  Medical History/Surgical History: reviewed Past Medical History:  Diagnosis Date   Complication of anesthesia    per pt, sensitive to sedation/ B/P drops!   Hyperlipidemia    Osteoarthritis    knee and hip   PONV (postoperative nausea and vomiting)    lowers BP and severe nausea    Past Surgical History:  Procedure Laterality Date   COLONOSCOPY     EYE SURGERY     KNEE SURGERY  1991   partial acl tear reconstruction/ left knee  STRABISMUS SURGERY Bilateral 10/06/2017   Procedure: REPAIR STRABISMUS;  Surgeon: Neysa Fallow, MD;  Location: [facility];  Service: Ophthalmology;  Laterality: Bilateral;   total hip resurface   2009   right    Medications: Current Outpatient Medications  Medication Sig Dispense Refill   acetaminophen  (TYLENOL ) 325 MG tablet Take 650 mg by mouth every 6 (six) hours as needed for moderate pain.     atorvastatin (LIPITOR) 20 MG tablet TAKE 1 TABLET ONCE DAILY. (Patient taking differently: Take 20 mg by  mouth daily at 6 PM. ) 90 tablet 1   budesonide -formoterol (SYMBICORT) 160-4.5 MCG/ACT inhaler Inhale 2 puffs into the lungs 2 (two) times daily.      cetirizine (ZYRTEC) 10 MG tablet Take 10 mg by mouth daily.     fish oil-omega-3 fatty acids 1000 MG capsule Take 2 g by mouth 2 (two) times daily. Take 2 bid      fluticasone (FLONASE) 50 MCG/ACT nasal spray Place 1 spray into both nostrils daily.     ibuprofen (ADVIL,MOTRIN) 200 MG tablet Take 400 mg by mouth every 6 (six) hours as needed for moderate pain.     levofloxacin (LEVAQUIN) 500 MG tablet Take 500 mg by mouth daily.      sildenafil (VIAGRA) 100 MG tablet Take 100 mg by mouth. Take 1/4 pills occasionally     No current facility-administered medications for this visit.    Allergies  Allergen Reactions   Penicillins     REACTION: ? Childhood. Did it involve swelling of the face/tongue/throat, SOB, or low BP? Unknown Did it involve sudden or severe rash/hives, skin peeling, or any reaction on the inside of your mouth or nose? No Did you need to seek medical attention at a hospital or doctor's office? Unknown When did it last happen?       If all above answers are "NO", may proceed with cephalosporin use.   Initial session: Kristopher Holden says his twins )Kristopher Holden and Kristopher Holden) are 24 and both graduated college with jobs. He says they both were fine until 8th grade, but then things went south with them. They both were identified as being on the spectrum. Says they both did not have friends in college. Kristopher Holden spent time in [place] before coming back to [place]  and joining another company. Kristopher Holden says that his interactions are limited and he is socially awkward. The brothers travel together every year. Kristopher Holden is extremely detached and will speak only if spoken to. The boys are very smart, but on the spectrum. Expresses considerable worry/concern about how to address concerns and intervene in a way to encourage treatment/support for his boys.     Treatment  Plan/goals Patient will participate in outpatient psychotherapy with the goal of establishing a plan to provide sons with help managing their interpersonal struggles. Will utilize family systems strategies to foster better communication. Will also work to establish more realistic expectations and help him manage his own anxiety associated with his concerns. Therapy will be on an as needed basis. Goal date is 12-25.   Patient was seen in provider's office  Session Note: Arnoldo took the boys to [place]. At one point he was alone with Kristopher Holden and asked him if he was happy. Kristopher Holden responded I don't know. He made some recommendations to his son to get more involved in activities. His son responded positively, but still has not acted on it. He talked to [facility] on a Facetime call on Sunday (as he does every Sunday). Kristopher Holden told  him how frustrated he is with his job, sharing many details. Boris is frustrated that it was such a normal conversation, when Kristopher Holden is usually non-communicative. Ishaan was both happy and agitated simultaneously. He is more hopeful about Kristopher Holden making a change at some point, but Kristopher Holden is also isolated with less potential. Discussed best approach to interact with with them.          Diagnoses:  Generalized Anxiety  Plan of Care: outpatient psychotherapy

## 2023-06-13 DIAGNOSIS — M25562 Pain in left knee: Secondary | ICD-10-CM | POA: Diagnosis not present

## 2023-06-13 DIAGNOSIS — R269 Unspecified abnormalities of gait and mobility: Secondary | ICD-10-CM | POA: Diagnosis not present

## 2023-06-22 DIAGNOSIS — R269 Unspecified abnormalities of gait and mobility: Secondary | ICD-10-CM | POA: Diagnosis not present

## 2023-06-22 DIAGNOSIS — M25562 Pain in left knee: Secondary | ICD-10-CM | POA: Diagnosis not present

## 2023-07-06 DIAGNOSIS — M25562 Pain in left knee: Secondary | ICD-10-CM | POA: Diagnosis not present

## 2023-07-06 DIAGNOSIS — R269 Unspecified abnormalities of gait and mobility: Secondary | ICD-10-CM | POA: Diagnosis not present

## 2023-08-07 DIAGNOSIS — Z471 Aftercare following joint replacement surgery: Secondary | ICD-10-CM | POA: Diagnosis not present

## 2023-08-07 DIAGNOSIS — Z96652 Presence of left artificial knee joint: Secondary | ICD-10-CM | POA: Diagnosis not present

## 2023-08-09 DIAGNOSIS — J189 Pneumonia, unspecified organism: Secondary | ICD-10-CM | POA: Diagnosis not present

## 2023-08-09 DIAGNOSIS — R051 Acute cough: Secondary | ICD-10-CM | POA: Diagnosis not present

## 2023-08-09 DIAGNOSIS — R0981 Nasal congestion: Secondary | ICD-10-CM | POA: Diagnosis not present

## 2023-08-28 DIAGNOSIS — M25512 Pain in left shoulder: Secondary | ICD-10-CM | POA: Diagnosis not present

## 2023-08-28 DIAGNOSIS — M25511 Pain in right shoulder: Secondary | ICD-10-CM | POA: Diagnosis not present

## 2023-09-06 DIAGNOSIS — J189 Pneumonia, unspecified organism: Secondary | ICD-10-CM | POA: Diagnosis not present

## 2023-09-06 DIAGNOSIS — M25512 Pain in left shoulder: Secondary | ICD-10-CM | POA: Diagnosis not present

## 2023-09-06 DIAGNOSIS — M25511 Pain in right shoulder: Secondary | ICD-10-CM | POA: Diagnosis not present

## 2023-09-06 DIAGNOSIS — M542 Cervicalgia: Secondary | ICD-10-CM | POA: Diagnosis not present

## 2023-09-07 DIAGNOSIS — M25512 Pain in left shoulder: Secondary | ICD-10-CM | POA: Diagnosis not present

## 2023-09-07 DIAGNOSIS — M542 Cervicalgia: Secondary | ICD-10-CM | POA: Diagnosis not present

## 2023-09-07 DIAGNOSIS — M25511 Pain in right shoulder: Secondary | ICD-10-CM | POA: Diagnosis not present

## 2023-09-21 DIAGNOSIS — M25511 Pain in right shoulder: Secondary | ICD-10-CM | POA: Diagnosis not present

## 2023-09-21 DIAGNOSIS — M25512 Pain in left shoulder: Secondary | ICD-10-CM | POA: Diagnosis not present

## 2023-09-21 DIAGNOSIS — M542 Cervicalgia: Secondary | ICD-10-CM | POA: Diagnosis not present

## 2023-10-17 DIAGNOSIS — M25512 Pain in left shoulder: Secondary | ICD-10-CM | POA: Diagnosis not present

## 2023-10-17 DIAGNOSIS — M542 Cervicalgia: Secondary | ICD-10-CM | POA: Diagnosis not present

## 2023-10-17 DIAGNOSIS — M25511 Pain in right shoulder: Secondary | ICD-10-CM | POA: Diagnosis not present

## 2023-12-05 DIAGNOSIS — H9193 Unspecified hearing loss, bilateral: Secondary | ICD-10-CM | POA: Diagnosis not present

## 2024-01-15 DIAGNOSIS — Z125 Encounter for screening for malignant neoplasm of prostate: Secondary | ICD-10-CM | POA: Diagnosis not present

## 2024-01-15 DIAGNOSIS — R82998 Other abnormal findings in urine: Secondary | ICD-10-CM | POA: Diagnosis not present

## 2024-01-15 DIAGNOSIS — Z1389 Encounter for screening for other disorder: Secondary | ICD-10-CM | POA: Diagnosis not present

## 2024-01-15 DIAGNOSIS — Z1212 Encounter for screening for malignant neoplasm of rectum: Secondary | ICD-10-CM | POA: Diagnosis not present

## 2024-02-20 ENCOUNTER — Ambulatory Visit: Payer: Self-pay | Admitting: Psychology

## 2024-02-20 DIAGNOSIS — F411 Generalized anxiety disorder: Secondary | ICD-10-CM | POA: Diagnosis not present

## 2024-02-20 NOTE — Progress Notes (Addendum)
 [facility] Counselor Initial Adult Exam  Name: Kristopher Holden Date: 02/20/2024 MRN: [id number] DOB: 03/06/1956 PCP: Kristopher Ade, MD    Guardian/Payee:  self    Paperwork requested: No   Reason for Visit Kristopher Holden Problem: Parenting concerns  Mental Status Exam: Appearance:   Casual     Behavior:  Appropriate  Motor:  Normal  Speech/Language:   Normal Rate  Affect:  Appropriate  Mood:  normal  Thought process:  normal  Thought content:    WNL  Sensory/Perceptual disturbances:    WNL  Orientation:  oriented to person, place, and situation  Attention:  Good  Concentration:  Good  Memory:  WNL  Fund of knowledge:   Good  Insight:    Good  Judgment:   Good  Impulse Control:  Good    Reported Symptoms:  worry, anxiety  Risk Assessment: Danger to Self:  No Self-injurious Behavior: No Danger to Others: No Duty to Warn:no Physical Aggression / Violence:No  Access to Firearms a concern: unknown Gang Involvement:No  Patient / guardian was educated about steps to take if suicide or homicide risk level increases between visits: n/a While future psychiatric events cannot be accurately predicted, the patient does not currently require acute inpatient psychiatric care and does not currently meet [facility]  involuntary commitment criteria.  Substance Abuse History: Current substance abuse: No     Past Psychiatric History:   No previous psychological problems have been observed Outpatient Providers:Kristopher Holden, Ph.D. History of Psych Hospitalization: No  Psychological Testing: N/A   Abuse History:  Victim of: No., N/A   Report needed: No. Victim of Neglect:No. Perpetrator of N/A  Witness / Exposure to Domestic Violence: No   Protective Services Involvement: No  Witness to Kristopher Holden Violence:  No   Family History:  Family History  Problem Relation Age of Onset   Lung cancer  Mother    Hyperlipidemia Mother    Diabetes Maternal Aunt    Heart attack Father    Healthy Brother    Healthy Son    Hypertension Neg Hx     Living situation: the patient lives with their spouse  Sexual Orientation: Straight  Relationship Status: married  Name of spouse / other:unknown If a parent, number of children / ages:Twin boys aged 63  Support Systems: N/A  Financial Stress:  No   Income/Employment/Disability: Employment  Financial Planner: No   Educational History: Education: post engineer, maintenance (it) work or Engineer, Technical Sales: unknown  Any cultural differences that may affect / interfere with treatment:  not applicable   Recreation/Hobbies: unknown  Stressors: Marital or family conflict    Strengths: Supportive Relationships  Barriers:  unknown   Legal History: Pending legal issue / charges: The patient has no significant history of legal issues. History of legal issue / charges: N/A  Medical History/Surgical History: reviewed Past Medical History:  Diagnosis Date   Complication of anesthesia    per pt, sensitive to sedation/ B/P drops!   Hyperlipidemia    Osteoarthritis    knee and hip   PONV (postoperative nausea and vomiting)    lowers BP and severe nausea    Past Surgical History:  Procedure Laterality Date   COLONOSCOPY     EYE SURGERY     KNEE SURGERY  1991   partial acl tear  reconstruction/ left knee   STRABISMUS SURGERY Bilateral 10/06/2017   Procedure: REPAIR STRABISMUS;  Surgeon: Kristopher Fallow, MD;  Location: [facility];  Service: Ophthalmology;  Laterality: Bilateral;   total hip resurface   2009   right    Medications: Current Outpatient Medications  Medication Sig Dispense Refill   acetaminophen  (TYLENOL ) 325 MG tablet Take 650 mg by mouth every 6 (six) hours as needed for moderate pain.     atorvastatin (LIPITOR) 20 MG tablet TAKE 1 TABLET ONCE DAILY. (Patient taking differently: Take 20 mg  by mouth daily at 6 PM. ) 90 tablet 1   budesonide -formoterol (SYMBICORT) 160-4.5 MCG/ACT inhaler Inhale 2 puffs into the lungs 2 (two) times daily.      cetirizine (ZYRTEC) 10 MG tablet Take 10 mg by mouth daily.     fish oil-omega-3 fatty acids 1000 MG capsule Take 2 g by mouth 2 (two) times daily. Take 2 bid      fluticasone (FLONASE) 50 MCG/ACT nasal spray Place 1 spray into both nostrils daily.     ibuprofen (ADVIL,MOTRIN) 200 MG tablet Take 400 mg by mouth every 6 (six) hours as needed for moderate pain.     levofloxacin (LEVAQUIN) 500 MG tablet Take 500 mg by mouth daily.      sildenafil (VIAGRA) 100 MG tablet Take 100 mg by mouth. Take 1/4 pills occasionally     No current facility-administered medications for this visit.    Allergies  Allergen Reactions   Penicillins     REACTION: ? Childhood. Did it involve swelling of the face/tongue/throat, SOB, or low BP? Unknown Did it involve sudden or severe rash/hives, skin peeling, or any reaction on the inside of your mouth or nose? No Did you need to seek medical attention at a hospital or doctor's office? Unknown When did it last happen?       If all above answers are "NO", may proceed with cephalosporin use.   Initial session: Kristopher Holden says his twins )[name] and [name] 87 and both graduated college with jobs. He says they both were fine until 8th grade, but then things went south with them. They both were identified as being on the spectrum. Says they both did not have friends in college. [name] spent time in [place] before coming back to [place]  and joining another company. [name] says that his interactions are limited and he is socially awkward. The brothers travel together every year. [name] is extremely detached and will speak only if spoken to. The boys are very smart, but on the spectrum. Expresses considerable worry/concern about how to address concerns and intervene in a way to encourage treatment/support for his boys.      Treatment Plan/goals Patient will participate in outpatient psychotherapy with the goal of establishing a plan to provide sons with help managing their interpersonal struggles. Will utilize family systems strategies to foster better communication. Will also work to establish more realistic expectations and help him manage his own anxiety associated with his concerns. Therapy will be on an as needed basis. Goal date is 12-25.   Patient agreed to video (Caregility) session and understands limits to the platform. He is at home and provider in home office.   Session Note: Dinh says that he talks with the boys every week and the conversations are very light without much detail. He is agitated with the boys for no follow through. He is distressed by his son's lack of any connection. Talked about the possibility that his boys  are both on the spectrum. Discussed plans for how to manage vacation and have meaningful discussions about them getting help.             Diagnoses:  Generalized Anxiety  Plan of Care: outpatient psychotherapy  Time: 8:30a- 9:20a 50 minutes  Alm Ferretti, Ph.D.
